# Patient Record
Sex: Male | Born: 1975 | Race: Black or African American | Hispanic: No | Smoking: Never smoker
Health system: Southern US, Community
[De-identification: ages and names within clinical notes are randomized; demographics above are authoritative.]

## PROBLEM LIST (undated history)

## (undated) DIAGNOSIS — I428 Other cardiomyopathies: Secondary | ICD-10-CM

## (undated) DIAGNOSIS — Z950 Presence of cardiac pacemaker: Secondary | ICD-10-CM

## (undated) DIAGNOSIS — N185 Chronic kidney disease, stage 5: Secondary | ICD-10-CM

## (undated) DIAGNOSIS — N289 Disorder of kidney and ureter, unspecified: Secondary | ICD-10-CM

## (undated) DIAGNOSIS — I2510 Atherosclerotic heart disease of native coronary artery without angina pectoris: Secondary | ICD-10-CM

## (undated) DIAGNOSIS — I639 Cerebral infarction, unspecified: Secondary | ICD-10-CM

## (undated) DIAGNOSIS — I480 Paroxysmal atrial fibrillation: Secondary | ICD-10-CM

## (undated) DIAGNOSIS — R002 Palpitations: Secondary | ICD-10-CM

## (undated) DIAGNOSIS — M109 Gout, unspecified: Secondary | ICD-10-CM

## (undated) DIAGNOSIS — I484 Atypical atrial flutter: Secondary | ICD-10-CM

## (undated) DIAGNOSIS — E119 Type 2 diabetes mellitus without complications: Secondary | ICD-10-CM

## (undated) DIAGNOSIS — I5020 Unspecified systolic (congestive) heart failure: Secondary | ICD-10-CM

## (undated) DIAGNOSIS — I509 Heart failure, unspecified: Secondary | ICD-10-CM

## (undated) DIAGNOSIS — Z95810 Presence of automatic (implantable) cardiac defibrillator: Secondary | ICD-10-CM

## (undated) DIAGNOSIS — I499 Cardiac arrhythmia, unspecified: Secondary | ICD-10-CM

## (undated) DIAGNOSIS — I779 Disorder of arteries and arterioles, unspecified: Secondary | ICD-10-CM

## (undated) HISTORY — PX: ABLATION: SHX5711

## (undated) HISTORY — PX: CARDIAC DEFIBRILLATOR PLACEMENT: SHX171

## (undated) HISTORY — PX: INSERT / REPLACE / REMOVE PACEMAKER: SUR710

## (undated) HISTORY — PX: CORONARY ANGIOPLASTY: SHX604

---

## 2008-07-30 ENCOUNTER — Inpatient Hospital Stay: Payer: Self-pay | Admitting: Specialist

## 2008-09-02 ENCOUNTER — Other Ambulatory Visit: Payer: Self-pay | Admitting: Nephrology

## 2009-01-03 ENCOUNTER — Inpatient Hospital Stay: Payer: Self-pay | Admitting: Internal Medicine

## 2010-02-18 ENCOUNTER — Observation Stay: Payer: Self-pay | Admitting: Specialist

## 2010-04-26 ENCOUNTER — Inpatient Hospital Stay: Payer: Self-pay | Admitting: Internal Medicine

## 2010-05-04 ENCOUNTER — Other Ambulatory Visit: Payer: Self-pay | Admitting: Internal Medicine

## 2010-05-10 ENCOUNTER — Inpatient Hospital Stay: Payer: Self-pay | Admitting: Internal Medicine

## 2012-01-18 ENCOUNTER — Emergency Department: Payer: Self-pay | Admitting: Emergency Medicine

## 2012-01-19 LAB — CBC
HCT: 45 % (ref 40.0–52.0)
HGB: 15 g/dL (ref 13.0–18.0)
MCHC: 33.4 g/dL (ref 32.0–36.0)
MCV: 89 fL (ref 80–100)
RBC: 5.08 10*6/uL (ref 4.40–5.90)
WBC: 10.1 10*3/uL (ref 3.8–10.6)

## 2012-01-19 LAB — BASIC METABOLIC PANEL
Anion Gap: 7 (ref 7–16)
BUN: 14 mg/dL (ref 7–18)
Calcium, Total: 8.9 mg/dL (ref 8.5–10.1)
Chloride: 100 mmol/L (ref 98–107)
Co2: 26 mmol/L (ref 21–32)
Creatinine: 0.95 mg/dL (ref 0.60–1.30)
Osmolality: 291 (ref 275–301)

## 2012-01-20 LAB — HEMOGLOBIN A1C

## 2017-01-11 ENCOUNTER — Ambulatory Visit: Admission: RE | Admit: 2017-01-11 | Payer: Medicare Other | Source: Ambulatory Visit | Admitting: Ophthalmology

## 2017-01-11 ENCOUNTER — Encounter: Admission: RE | Payer: Self-pay | Source: Ambulatory Visit

## 2017-01-11 SURGERY — PHACOEMULSIFICATION, CATARACT, WITH IOL INSERTION
Anesthesia: Choice | Laterality: Right

## 2017-02-14 ENCOUNTER — Encounter: Payer: Self-pay | Admitting: *Deleted

## 2017-02-14 NOTE — Pre-Procedure Instructions (Signed)
CLEARED BY DR York Ram Chrissie Noa 02/04/17

## 2017-02-15 ENCOUNTER — Encounter
Admission: RE | Disposition: A | Discharge status: Home / Self Care | Payer: Self-pay | Source: Ambulatory Visit | Attending: Ophthalmology

## 2017-02-15 ENCOUNTER — Ambulatory Visit
Admission: RE | Admit: 2017-02-15 | Discharge: 2017-02-15 | Disposition: A | Discharge status: Home / Self Care | Payer: Medicare Other | Source: Ambulatory Visit | Attending: Ophthalmology | Admitting: Ophthalmology

## 2017-02-15 ENCOUNTER — Ambulatory Visit: Payer: Medicare Other | Admitting: Certified Registered Nurse Anesthetist

## 2017-02-15 ENCOUNTER — Other Ambulatory Visit: Payer: Self-pay

## 2017-02-15 ENCOUNTER — Encounter: Payer: Self-pay | Admitting: *Deleted

## 2017-02-15 DIAGNOSIS — I509 Heart failure, unspecified: Secondary | ICD-10-CM | POA: Insufficient documentation

## 2017-02-15 DIAGNOSIS — E119 Type 2 diabetes mellitus without complications: Secondary | ICD-10-CM | POA: Insufficient documentation

## 2017-02-15 DIAGNOSIS — H2511 Age-related nuclear cataract, right eye: Secondary | ICD-10-CM | POA: Insufficient documentation

## 2017-02-15 DIAGNOSIS — I2510 Atherosclerotic heart disease of native coronary artery without angina pectoris: Secondary | ICD-10-CM | POA: Insufficient documentation

## 2017-02-15 DIAGNOSIS — Z7982 Long term (current) use of aspirin: Secondary | ICD-10-CM | POA: Diagnosis not present

## 2017-02-15 DIAGNOSIS — Z6839 Body mass index (BMI) 39.0-39.9, adult: Secondary | ICD-10-CM | POA: Diagnosis not present

## 2017-02-15 DIAGNOSIS — Z95810 Presence of automatic (implantable) cardiac defibrillator: Secondary | ICD-10-CM | POA: Insufficient documentation

## 2017-02-15 DIAGNOSIS — Z79899 Other long term (current) drug therapy: Secondary | ICD-10-CM | POA: Diagnosis not present

## 2017-02-15 DIAGNOSIS — I4891 Unspecified atrial fibrillation: Secondary | ICD-10-CM | POA: Insufficient documentation

## 2017-02-15 DIAGNOSIS — Z9861 Coronary angioplasty status: Secondary | ICD-10-CM | POA: Insufficient documentation

## 2017-02-15 DIAGNOSIS — I110 Hypertensive heart disease with heart failure: Secondary | ICD-10-CM | POA: Insufficient documentation

## 2017-02-15 HISTORY — DX: Atherosclerotic heart disease of native coronary artery without angina pectoris: I25.10

## 2017-02-15 HISTORY — PX: CATARACT EXTRACTION W/PHACO: SHX586

## 2017-02-15 HISTORY — DX: Heart failure, unspecified: I50.9

## 2017-02-15 HISTORY — DX: Palpitations: R00.2

## 2017-02-15 HISTORY — DX: Cardiac arrhythmia, unspecified: I49.9

## 2017-02-15 HISTORY — DX: Type 2 diabetes mellitus without complications: E11.9

## 2017-02-15 HISTORY — DX: Presence of cardiac pacemaker: Z95.0

## 2017-02-15 LAB — GLUCOSE, CAPILLARY: Glucose-Capillary: 287 mg/dL — ABNORMAL HIGH (ref 65–99)

## 2017-02-15 SURGERY — PHACOEMULSIFICATION, CATARACT, WITH IOL INSERTION
Anesthesia: Topical | Site: Eye | Laterality: Right | Wound class: Clean

## 2017-02-15 MED ORDER — EPINEPHRINE PF 1 MG/ML IJ SOLN
INTRAMUSCULAR | Status: AC
  Filled 2017-02-15: qty 1

## 2017-02-15 MED ORDER — NA CHONDROIT SULF-NA HYALURON 40-17 MG/ML IO SOLN
INTRAOCULAR | Status: DC | PRN
  Administered 2017-02-15: 1 mL via INTRAOCULAR

## 2017-02-15 MED ORDER — ARMC OPHTHALMIC DILATING DROPS
1.0000 "application " | OPHTHALMIC | Status: AC
  Administered 2017-02-15 (×2): 1 via OPHTHALMIC

## 2017-02-15 MED ORDER — INSULIN ASPART 100 UNIT/ML ~~LOC~~ SOLN
SUBCUTANEOUS | Status: AC
  Filled 2017-02-15: qty 1

## 2017-02-15 MED ORDER — MOXIFLOXACIN HCL 0.5 % OP SOLN
OPHTHALMIC | Status: AC
  Filled 2017-02-15: qty 3

## 2017-02-15 MED ORDER — LIDOCAINE HCL (PF) 4 % IJ SOLN
INTRAMUSCULAR | Status: AC
  Filled 2017-02-15: qty 5

## 2017-02-15 MED ORDER — ARMC OPHTHALMIC DILATING DROPS
OPHTHALMIC | Status: AC
  Administered 2017-02-15: 07:00:00
  Filled 2017-02-15: qty 0.4

## 2017-02-15 MED ORDER — NA CHONDROIT SULF-NA HYALURON 40-17 MG/ML IO SOLN
INTRAOCULAR | Status: AC
  Filled 2017-02-15: qty 1

## 2017-02-15 MED ORDER — FENTANYL CITRATE (PF) 100 MCG/2ML IJ SOLN
INTRAMUSCULAR | Status: DC | PRN
  Administered 2017-02-15: 25 ug via INTRAVENOUS
  Administered 2017-02-15: 75 ug via INTRAVENOUS

## 2017-02-15 MED ORDER — SODIUM CHLORIDE 0.9 % IV SOLN
INTRAVENOUS | Status: DC
  Administered 2017-02-15: 06:00:00 via INTRAVENOUS

## 2017-02-15 MED ORDER — LIDOCAINE HCL (PF) 4 % IJ SOLN
INTRAOCULAR | Status: DC | PRN
  Administered 2017-02-15: 2 mL via OPHTHALMIC

## 2017-02-15 MED ORDER — EPINEPHRINE PF 1 MG/ML IJ SOLN
INTRAOCULAR | Status: DC | PRN
  Administered 2017-02-15: 1 mL via OPHTHALMIC

## 2017-02-15 MED ORDER — POVIDONE-IODINE 5 % OP SOLN
OPHTHALMIC | Status: AC
  Filled 2017-02-15: qty 30

## 2017-02-15 MED ORDER — CARBACHOL 0.01 % IO SOLN
INTRAOCULAR | Status: DC | PRN
  Administered 2017-02-15: 0.5 mL via INTRAOCULAR

## 2017-02-15 MED ORDER — INSULIN ASPART 100 UNIT/ML ~~LOC~~ SOLN
4.0000 [IU] | Freq: Once | SUBCUTANEOUS | Status: AC
  Administered 2017-02-15: 4 [IU] via SUBCUTANEOUS

## 2017-02-15 MED ORDER — MOXIFLOXACIN HCL 0.5 % OP SOLN
OPHTHALMIC | Status: DC | PRN
  Administered 2017-02-15: 0.2 mL via OPHTHALMIC

## 2017-02-15 MED ORDER — POVIDONE-IODINE 5 % OP SOLN
OPHTHALMIC | Status: DC | PRN
  Administered 2017-02-15: 1 via OPHTHALMIC

## 2017-02-15 MED ORDER — FENTANYL CITRATE (PF) 100 MCG/2ML IJ SOLN
INTRAMUSCULAR | Status: AC
  Filled 2017-02-15: qty 2

## 2017-02-15 MED ORDER — ONDANSETRON HCL 4 MG/2ML IJ SOLN: 4.0000 mg | Freq: Once | INTRAMUSCULAR | Status: DC | PRN

## 2017-02-15 MED ORDER — MOXIFLOXACIN HCL 0.5 % OP SOLN: 1.0000 [drp] | OPHTHALMIC | Status: DC | PRN

## 2017-02-15 MED ORDER — FENTANYL CITRATE (PF) 100 MCG/2ML IJ SOLN: 25.0000 ug | INTRAMUSCULAR | Status: DC | PRN

## 2017-02-15 SURGICAL SUPPLY — 16 items
GLOVE BIO SURGEON STRL SZ8 (GLOVE) ×3 IMPLANT
GLOVE BIOGEL M 6.5 STRL (GLOVE) ×3 IMPLANT
GLOVE SURG LX 8.0 MICRO (GLOVE) ×2
GLOVE SURG LX STRL 8.0 MICRO (GLOVE) ×1 IMPLANT
GOWN STRL REUS W/ TWL LRG LVL3 (GOWN DISPOSABLE) ×2 IMPLANT
GOWN STRL REUS W/TWL LRG LVL3 (GOWN DISPOSABLE) ×4
LABEL CATARACT MEDS ST (LABEL) ×3 IMPLANT
LENS IOL TECNIS ITEC 24.5 (Intraocular Lens) ×3 IMPLANT
PACK CATARACT (MISCELLANEOUS) ×3 IMPLANT
PACK CATARACT BRASINGTON LX (MISCELLANEOUS) ×3 IMPLANT
PACK EYE AFTER SURG (MISCELLANEOUS) ×3 IMPLANT
SOL BSS BAG (MISCELLANEOUS) ×3
SOLUTION BSS BAG (MISCELLANEOUS) ×1 IMPLANT
SYR 5ML LL (SYRINGE) ×3 IMPLANT
WATER STERILE IRR 250ML POUR (IV SOLUTION) ×3 IMPLANT
WIPE NON LINTING 3.25X3.25 (MISCELLANEOUS) ×3 IMPLANT

## 2017-02-15 NOTE — Discharge Instructions (Signed)
Eye Surgery Discharge Instructions  Expect mild scratchy sensation or mild soreness. DO NOT RUB YOUR EYE!  The day of surgery:  Minimal physical activity, but bed rest is not required  No reading, computer work, or close hand work  No bending, lifting, or straining.  May watch TV  For 24 hours:  No driving, legal decisions, or alcoholic beverages  Safety precautions  Eat anything you prefer: It is better to start with liquids, then soup then solid foods.  _____ Eye patch should be worn until postoperative exam tomorrow.  ____ Solar shield eyeglasses should be worn for comfort in the sunlight/patch while sleeping  Resume all regular medications including aspirin or Coumadin if these were discontinued prior to surgery. You may shower, bathe, shave, or wash your hair. Tylenol may be taken for mild discomfort.  Call your doctor if you experience significant pain, nausea, or vomiting, fever > 101 or other signs of infection. [phone] or [phone] Specific instructions:  Follow-up Information    Birder Robson, MD Follow up on 02/16/2017.   Specialty:  Ophthalmology Why:  10:25 Contact information: [address] [place] [address] [phone]

## 2017-02-15 NOTE — Op Note (Signed)
PREOPERATIVE DIAGNOSIS:  Nuclear sclerotic cataract of the right eye.   POSTOPERATIVE DIAGNOSIS:  nuclear sclerotic cataract right eye   OPERATIVE PROCEDURE: Procedure(s): CATARACT EXTRACTION PHACO AND INTRAOCULAR LENS PLACEMENT (IOC)   SURGEON:  Birder Robson, MD.   ANESTHESIA:  Anesthesiologist: Molli Barrows, MD CRNA: Demetrius Charity, CRNA  1.      Managed anesthesia care. 2.      0.51ml of Shugarcaine was instilled in the eye following the paracentesis.   COMPLICATIONS:  None.   TECHNIQUE:   Stop and chop   DESCRIPTION OF PROCEDURE:  The patient was examined and consented in the preoperative holding area where the aforementioned topical anesthesia was applied to the right eye and then brought back to the Operating Room where the right eye was prepped and draped in the usual sterile ophthalmic fashion and a lid speculum was placed. A paracentesis was created with the side port blade and the anterior chamber was filled with viscoelastic. A near clear corneal incision was performed with the steel keratome. A continuous curvilinear capsulorrhexis was performed with a cystotome followed by the capsulorrhexis forceps. Hydrodissection and hydrodelineation were carried out with BSS on a blunt cannula. The lens was removed in a stop and chop  technique and the remaining cortical material was removed with the irrigation-aspiration handpiece. The capsular bag was inflated with viscoelastic and the Technis ZCB00  lens was placed in the capsular bag without complication. The remaining viscoelastic was removed from the eye with the irrigation-aspiration handpiece. The wounds were hydrated. The anterior chamber was flushed with Miostat and the eye was inflated to physiologic pressure. 0.48ml of Vigamox was placed in the anterior chamber. The wounds were found to be water tight. The eye was dressed with Vigamox. The patient was given protective glasses to wear throughout the day and a shield with which  to sleep tonight. The patient was also given drops with which to begin a drop regimen today and will follow-up with me in one day. Implant Name Type Inv. Item Serial No. Manufacturer Lot No. LRB No. Used  LENS IOL DIOP 24.5 - [id number] Intraocular Lens LENS IOL DIOP 24.5 [id number]  Right 1   Procedure(s) with comments: CATARACT EXTRACTION PHACO AND INTRAOCULAR LENS PLACEMENT (IOC) (Right) - [place] 00:48 AP% 14.8 CDE 7.11 Fluid pack lot # [id number]  Electronically signed: Birder Robson 02/15/2017 7:48 AM

## 2017-02-15 NOTE — Anesthesia Procedure Notes (Signed)
Procedure Name: MAC Performed by: Robb Sibal, CRNA Pre-anesthesia Checklist: Patient identified, Emergency Drugs available, Suction available, Patient being monitored and Timeout performed Oxygen Delivery Method: Nasal cannula       

## 2017-02-15 NOTE — Anesthesia Post-op Follow-up Note (Signed)
Anesthesia QCDR form completed.        

## 2017-02-15 NOTE — H&P (Signed)
All labs reviewed. Abnormal studies sent to patients PCP when indicated.  Previous H&P reviewed, patient examined, there are NO CHANGES.  Terry Willcox Porfilio1/15/20197:16 AM

## 2017-02-15 NOTE — Anesthesia Postprocedure Evaluation (Signed)
Anesthesia Post Note  Patient: Terry [name]  Procedure(s) Performed: CATARACT EXTRACTION PHACO AND INTRAOCULAR LENS PLACEMENT ([place]) (Right Eye)  Patient location during evaluation: PACU Anesthesia Type: MAC Level of consciousness: awake and alert and oriented Pain management: pain level controlled Vital Signs Assessment: post-procedure vital signs reviewed and stable Respiratory status: spontaneous breathing Cardiovascular status: blood pressure returned to baseline Anesthetic complications: no     Last Vitals:  Vitals:   02/15/17 0617  BP: (!) 162/96  Pulse: 77  Resp: 16  Temp: 36.5 C  SpO2: 100%    Last Pain:  Vitals:   02/15/17 0617  TempSrc: Oral                 Blima Singer

## 2017-02-15 NOTE — Transfer of Care (Signed)
Immediate Anesthesia Transfer of Care Note  Patient: Terry [name]  Procedure(s) Performed: CATARACT EXTRACTION PHACO AND INTRAOCULAR LENS PLACEMENT (IOC) (Right Eye)  Patient Location: PACU  Anesthesia Type:MAC  Level of Consciousness: awake, alert  and oriented  Airway & Oxygen Therapy: Patient Spontanous Breathing  Post-op Assessment: Report given to RN and Post -op Vital signs reviewed and stable  Post vital signs: Reviewed and stable  Last Vitals:  Vitals:   02/15/17 0617  BP: (!) 162/96  Pulse: 77  Resp: 16  Temp: 36.5 C  SpO2: 100%    Last Pain:  Vitals:   02/15/17 0617  TempSrc: Oral         Complications: No apparent anesthesia complications

## 2017-02-15 NOTE — Anesthesia Preprocedure Evaluation (Signed)
Anesthesia Evaluation  Patient identified by MRN, date of birth, ID band Patient awake    Reviewed: Allergy & Precautions, H&P , NPO status , Patient's Chart, lab work & pertinent test results, reviewed documented beta blocker date and time   Airway Mallampati: III  TM Distance: >3 FB Neck ROM: full    Dental no notable dental hx. (+) Teeth Intact   Pulmonary neg pulmonary ROS,    Pulmonary exam normal breath sounds clear to auscultation       Cardiovascular Exercise Tolerance: Good hypertension, + CAD and +CHF  negative cardio ROS  + dysrhythmias + pacemaker  Rhythm:regular Rate:Normal     Neuro/Psych negative neurological ROS  negative psych ROS   GI/Hepatic negative GI ROS, Neg liver ROS,   Endo/Other  negative endocrine ROSdiabetes, Poorly ControlledMorbid obesity  Renal/GU      Musculoskeletal   Abdominal   Peds  Hematology negative hematology ROS (+)   Anesthesia Other Findings   Reproductive/Obstetrics negative OB ROS                             Anesthesia Physical Anesthesia Plan  ASA: III  Anesthesia Plan: MAC   Post-op Pain Management:    Induction:   PONV Risk Score and Plan:   Airway Management Planned:   Additional Equipment:   Intra-op Plan:   Post-operative Plan:   Informed Consent: I have reviewed the patients History and Physical, chart, labs and discussed the procedure including the risks, benefits and alternatives for the proposed anesthesia with the patient or authorized representative who has indicated his/her understanding and acceptance.     Plan Discussed with: CRNA  Anesthesia Plan Comments:         Anesthesia Quick Evaluation

## 2017-02-16 ENCOUNTER — Encounter: Payer: Self-pay | Admitting: Ophthalmology

## 2017-03-07 ENCOUNTER — Encounter: Payer: Self-pay | Admitting: *Deleted

## 2017-03-15 ENCOUNTER — Ambulatory Visit: Payer: Medicare Other | Admitting: Anesthesiology

## 2017-03-15 ENCOUNTER — Encounter
Admission: RE | Disposition: A | Discharge status: Home / Self Care | Payer: Self-pay | Source: Ambulatory Visit | Attending: Ophthalmology

## 2017-03-15 ENCOUNTER — Ambulatory Visit
Admission: RE | Admit: 2017-03-15 | Discharge: 2017-03-15 | Disposition: A | Discharge status: Home / Self Care | Payer: Medicare Other | Source: Ambulatory Visit | Attending: Ophthalmology | Admitting: Ophthalmology

## 2017-03-15 DIAGNOSIS — I2510 Atherosclerotic heart disease of native coronary artery without angina pectoris: Secondary | ICD-10-CM | POA: Insufficient documentation

## 2017-03-15 DIAGNOSIS — I110 Hypertensive heart disease with heart failure: Secondary | ICD-10-CM | POA: Insufficient documentation

## 2017-03-15 DIAGNOSIS — I4891 Unspecified atrial fibrillation: Secondary | ICD-10-CM | POA: Insufficient documentation

## 2017-03-15 DIAGNOSIS — E1136 Type 2 diabetes mellitus with diabetic cataract: Secondary | ICD-10-CM | POA: Insufficient documentation

## 2017-03-15 DIAGNOSIS — I509 Heart failure, unspecified: Secondary | ICD-10-CM | POA: Diagnosis not present

## 2017-03-15 HISTORY — PX: CATARACT EXTRACTION W/PHACO: SHX586

## 2017-03-15 HISTORY — DX: Presence of automatic (implantable) cardiac defibrillator: Z95.810

## 2017-03-15 LAB — GLUCOSE, CAPILLARY
GLUCOSE-CAPILLARY: 471 mg/dL — AB (ref 65–99)
GLUCOSE-CAPILLARY: 486 mg/dL — AB (ref 65–99)
Glucose-Capillary: 463 mg/dL — ABNORMAL HIGH (ref 65–99)
Glucose-Capillary: 472 mg/dL — ABNORMAL HIGH (ref 65–99)
Glucose-Capillary: 379 mg/dL — ABNORMAL HIGH (ref 65–99)
Glucose-Capillary: 439 mg/dL — ABNORMAL HIGH (ref 65–99)

## 2017-03-15 SURGERY — PHACOEMULSIFICATION, CATARACT, WITH IOL INSERTION
Anesthesia: Monitor Anesthesia Care | Site: Eye | Laterality: Left | Wound class: Clean

## 2017-03-15 MED ORDER — FENTANYL CITRATE (PF) 100 MCG/2ML IJ SOLN
INTRAMUSCULAR | Status: AC
  Filled 2017-03-15: qty 2

## 2017-03-15 MED ORDER — SODIUM CHLORIDE 0.9 % IV SOLN
INTRAVENOUS | Status: DC
  Administered 2017-03-15: 09:00:00 via INTRAVENOUS

## 2017-03-15 MED ORDER — MOXIFLOXACIN HCL 0.5 % OP SOLN: 1.0000 [drp] | OPHTHALMIC | Status: DC | PRN

## 2017-03-15 MED ORDER — EPINEPHRINE PF 1 MG/ML IJ SOLN
INTRAOCULAR | Status: DC | PRN
  Administered 2017-03-15: 09:00:00 via OPHTHALMIC

## 2017-03-15 MED ORDER — INSULIN ASPART 100 UNIT/ML ~~LOC~~ SOLN
SUBCUTANEOUS | Status: AC
  Administered 2017-03-15: 10 [IU] via SUBCUTANEOUS
  Filled 2017-03-15: qty 1

## 2017-03-15 MED ORDER — MOXIFLOXACIN HCL 0.5 % OP SOLN
OPHTHALMIC | Status: AC
  Filled 2017-03-15: qty 3

## 2017-03-15 MED ORDER — ARMC OPHTHALMIC DILATING DROPS
1.0000 "application " | OPHTHALMIC | Status: AC
  Administered 2017-03-15 (×3): 1 via OPHTHALMIC

## 2017-03-15 MED ORDER — ARMC OPHTHALMIC DILATING DROPS
OPHTHALMIC | Status: AC
  Administered 2017-03-15: 1 via OPHTHALMIC
  Filled 2017-03-15: qty 0.4

## 2017-03-15 MED ORDER — TETRACAINE HCL 0.5 % OP SOLN
OPHTHALMIC | Status: AC
  Filled 2017-03-15: qty 4

## 2017-03-15 MED ORDER — MIDAZOLAM HCL 2 MG/2ML IJ SOLN
INTRAMUSCULAR | Status: AC
  Filled 2017-03-15: qty 2

## 2017-03-15 MED ORDER — MIDAZOLAM HCL 2 MG/2ML IJ SOLN
INTRAMUSCULAR | Status: DC | PRN
  Administered 2017-03-15: 2 mg via INTRAVENOUS

## 2017-03-15 MED ORDER — MOXIFLOXACIN HCL 0.5 % OP SOLN
OPHTHALMIC | Status: DC | PRN
  Administered 2017-03-15: 0.2 mL via OPHTHALMIC

## 2017-03-15 MED ORDER — NA CHONDROIT SULF-NA HYALURON 40-17 MG/ML IO SOLN
INTRAOCULAR | Status: DC | PRN
  Administered 2017-03-15: 1 mL via INTRAOCULAR

## 2017-03-15 MED ORDER — LIDOCAINE HCL (PF) 4 % IJ SOLN
INTRAOCULAR | Status: DC | PRN
  Administered 2017-03-15: 4 mL via OPHTHALMIC

## 2017-03-15 MED ORDER — INSULIN ASPART 100 UNIT/ML ~~LOC~~ SOLN
10.0000 [IU] | Freq: Once | SUBCUTANEOUS | Status: AC
  Administered 2017-03-15 (×2): 10 [IU] via SUBCUTANEOUS

## 2017-03-15 MED ORDER — POVIDONE-IODINE 5 % OP SOLN
OPHTHALMIC | Status: DC | PRN
  Administered 2017-03-15: 1 via OPHTHALMIC

## 2017-03-15 MED ORDER — INSULIN ASPART 100 UNIT/ML ~~LOC~~ SOLN: 10.0000 [IU] | Freq: Once | SUBCUTANEOUS | Status: DC

## 2017-03-15 MED ORDER — POVIDONE-IODINE 5 % OP SOLN
OPHTHALMIC | Status: AC
  Filled 2017-03-15: qty 30

## 2017-03-15 MED ORDER — EPINEPHRINE PF 1 MG/ML IJ SOLN
INTRAMUSCULAR | Status: AC
  Filled 2017-03-15: qty 2

## 2017-03-15 MED ORDER — LIDOCAINE HCL (PF) 4 % IJ SOLN
INTRAMUSCULAR | Status: AC
  Filled 2017-03-15: qty 5

## 2017-03-15 MED ORDER — NA CHONDROIT SULF-NA HYALURON 40-17 MG/ML IO SOLN
INTRAOCULAR | Status: AC
  Filled 2017-03-15: qty 1

## 2017-03-15 MED ORDER — FENTANYL CITRATE (PF) 100 MCG/2ML IJ SOLN
INTRAMUSCULAR | Status: DC | PRN
  Administered 2017-03-15: 50 ug via INTRAVENOUS

## 2017-03-15 MED ORDER — CARBACHOL 0.01 % IO SOLN
INTRAOCULAR | Status: DC | PRN
  Administered 2017-03-15: 0.5 mL via INTRAOCULAR

## 2017-03-15 MED ORDER — TETRACAINE HCL 0.5 % OP SOLN
OPHTHALMIC | Status: DC | PRN
  Administered 2017-03-15: 1 [drp] via OPHTHALMIC

## 2017-03-15 SURGICAL SUPPLY — 16 items
GLOVE BIO SURGEON STRL SZ8 (GLOVE) ×3 IMPLANT
GLOVE BIOGEL M 6.5 STRL (GLOVE) ×3 IMPLANT
GLOVE SURG LX 8.0 MICRO (GLOVE) ×2
GLOVE SURG LX STRL 8.0 MICRO (GLOVE) ×1 IMPLANT
GOWN STRL REUS W/ TWL LRG LVL3 (GOWN DISPOSABLE) ×2 IMPLANT
GOWN STRL REUS W/TWL LRG LVL3 (GOWN DISPOSABLE) ×4
LABEL CATARACT MEDS ST (LABEL) ×3 IMPLANT
LENS IOL TECNIS ITEC 24.0 (Intraocular Lens) ×3 IMPLANT
PACK CATARACT (MISCELLANEOUS) ×3 IMPLANT
PACK CATARACT BRASINGTON LX (MISCELLANEOUS) ×3 IMPLANT
PACK EYE AFTER SURG (MISCELLANEOUS) ×3 IMPLANT
SOL BSS BAG (MISCELLANEOUS) ×3
SOLUTION BSS BAG (MISCELLANEOUS) ×1 IMPLANT
SYR 5ML LL (SYRINGE) ×3 IMPLANT
WATER STERILE IRR 250ML POUR (IV SOLUTION) ×3 IMPLANT
WIPE NON LINTING 3.25X3.25 (MISCELLANEOUS) ×3 IMPLANT

## 2017-03-15 NOTE — Transfer of Care (Signed)
Immediate Anesthesia Transfer of Care Note  Patient: Terry [name]  Procedure(s) Performed: CATARACT EXTRACTION PHACO AND INTRAOCULAR LENS PLACEMENT (IOC) (Left Eye)  Patient Location: PACU  Anesthesia Type:MAC  Level of Consciousness: awake, alert  and oriented  Airway & Oxygen Therapy: Patient Spontanous Breathing  Post-op Assessment: Report given to RN and Post -op Vital signs reviewed and stable  Post vital signs: Reviewed and stable  Last Vitals:  Vitals:   03/15/17 0641  BP: 126/76  Pulse: 86  Resp: 19  Temp: (!) 36.3 C  SpO2: 100%    Last Pain:  Vitals:   03/15/17 0641  TempSrc: Tympanic         Complications: No apparent anesthesia complications

## 2017-03-15 NOTE — Anesthesia Postprocedure Evaluation (Signed)
Anesthesia Post Note  Patient: Terry [name]  Procedure(s) Performed: CATARACT EXTRACTION PHACO AND INTRAOCULAR LENS PLACEMENT ([place]) (Left Eye)  Patient location during evaluation: PACU Anesthesia Type: MAC Level of consciousness: awake and alert and oriented Pain management: pain level controlled Vital Signs Assessment: post-procedure vital signs reviewed and stable Respiratory status: spontaneous breathing, nonlabored ventilation and respiratory function stable Cardiovascular status: blood pressure returned to baseline and stable Postop Assessment: no signs of nausea or vomiting Anesthetic complications: no     Last Vitals:  Vitals:   03/15/17 0641 03/15/17 0920  BP: 126/76 (!) 93/51  Pulse: 86 91  Resp: 19 12  Temp: (!) 36.3 C (!) 36.3 C  SpO2: 100% 100%    Last Pain:  Vitals:   03/15/17 0920  TempSrc: Temporal                 Shalayne Leach

## 2017-03-15 NOTE — Op Note (Signed)
PREOPERATIVE DIAGNOSIS:  Nuclear sclerotic cataract of the left eye.   POSTOPERATIVE DIAGNOSIS:  Nuclear sclerotic cataract of the left eye.   OPERATIVE PROCEDURE: Procedure(s): CATARACT EXTRACTION PHACO AND INTRAOCULAR LENS PLACEMENT (IOC)   SURGEON:  Birder Robson, MD.   ANESTHESIA:  Anesthesiologist: Emmie Niemann, MD CRNA: Demetrius Charity, CRNA; Hedda Slade, CRNA  1.      Managed anesthesia care. 2.     0.6ml of Shugarcaine was instilled following the paracentesis   COMPLICATIONS:  None.   TECHNIQUE:   Stop and chop   DESCRIPTION OF PROCEDURE:  The patient was examined and consented in the preoperative holding area where the aforementioned topical anesthesia was applied to the left eye and then brought back to the Operating Room where the left eye was prepped and draped in the usual sterile ophthalmic fashion and a lid speculum was placed. A paracentesis was created with the side port blade and the anterior chamber was filled with viscoelastic. A near clear corneal incision was performed with the steel keratome. A continuous curvilinear capsulorrhexis was performed with a cystotome followed by the capsulorrhexis forceps. Hydrodissection and hydrodelineation were carried out with BSS on a blunt cannula. The lens was removed in a stop and chop  technique and the remaining cortical material was removed with the irrigation-aspiration handpiece. The capsular bag was inflated with viscoelastic and the Technis ZCB00 lens was placed in the capsular bag without complication. The remaining viscoelastic was removed from the eye with the irrigation-aspiration handpiece. The wounds were hydrated. The anterior chamber was flushed with Miostat and the eye was inflated to physiologic pressure. 0.36ml Vigamox was placed in the anterior chamber. The wounds were found to be water tight. The eye was dressed with Vigamox. The patient was given protective glasses to wear throughout the day and a shield with  which to sleep tonight. The patient was also given drops with which to begin a drop regimen today and will follow-up with me in one day. Implant Name Type Inv. Item Serial No. Manufacturer Lot No. LRB No. Used  LENS IOL DIOP 24.0 - [id number] Intraocular Lens LENS IOL DIOP 24.0 [id number]  Left 1    Procedure(s) with comments: CATARACT EXTRACTION PHACO AND INTRAOCULAR LENS PLACEMENT (IOC) (Left) - [place] 00:46.0 AP% 11.2 CDE 5.15 FLUID PACK LOT # [id number]  Electronically signed: Birder Robson 03/15/2017 9:18 AM

## 2017-03-15 NOTE — OR Nursing (Signed)
Dr Randa Lynn notified of glucose of 486 on arrival. Orders for Novalog regular insulin 10 units sq given. Rechecked glucose at 0738 and glucose down to 463. Rechecked at 0754 and elevated to 472. Additional orders for another 10 units of Regular Insulin ordered and given. Rechecked glucose at 086 and was 439. Dr Michelene Heady is aware and is going to proceed with surgery. Patient has an appointment later this month with primary physician to address this issue with elevated glucose levels.

## 2017-03-15 NOTE — Anesthesia Post-op Follow-up Note (Signed)
Anesthesia QCDR form completed.        

## 2017-03-15 NOTE — Anesthesia Procedure Notes (Signed)
Procedure Name: MAC Performed by: Demetrius Charity, CRNA Pre-anesthesia Checklist: Patient identified, Emergency Drugs available, Suction available, Patient being monitored and Timeout performed Oxygen Delivery Method: Nasal cannula

## 2017-03-15 NOTE — Discharge Instructions (Signed)
Eye Surgery Discharge Instructions  Expect mild scratchy sensation or mild soreness. DO NOT RUB YOUR EYE!  The day of surgery:  Minimal physical activity, but bed rest is not required  No reading, computer work, or close hand work  No bending, lifting, or straining.  May watch TV  For 24 hours:  No driving, legal decisions, or alcoholic beverages  Safety precautions  Eat anything you prefer: It is better to start with liquids, then soup then solid foods.  _____ Eye patch should be worn until postoperative exam tomorrow.  ____ Solar shield eyeglasses should be worn for comfort in the sunlight/patch while sleeping  Resume all regular medications including aspirin or Coumadin if these were discontinued prior to surgery. You may shower, bathe, shave, or wash your hair. Tylenol may be taken for mild discomfort.  Call your doctor if you experience significant pain, nausea, or vomiting, fever > 101 or other signs of infection. [phone] or [phone] Specific instructions:  Follow-up Information    Birder Robson, MD Follow up.   Specialty:  Ophthalmology Why:  February 13 at 10:35am Contact information: [address] [place] [address] [phone]

## 2017-03-15 NOTE — Anesthesia Preprocedure Evaluation (Signed)
Anesthesia Evaluation  Patient identified by MRN, date of birth, ID band Patient awake    Reviewed: Allergy & Precautions, NPO status , Patient's Chart, lab work & pertinent test results  History of Anesthesia Complications Negative for: history of anesthetic complications  Airway Mallampati: II  TM Distance: >3 FB Neck ROM: Full    Dental no notable dental hx.    Pulmonary neg pulmonary ROS, neg sleep apnea, neg COPD,    breath sounds clear to auscultation- rhonchi (-) wheezing      Cardiovascular Exercise Tolerance: Good (-) hypertension+CHF  (-) CAD, (-) Past MI, (-) Cardiac Stents and (-) CABG + dysrhythmias Atrial Fibrillation + pacemaker + Cardiac Defibrillator  Rhythm:Regular Rate:Normal - Systolic murmurs and - Diastolic murmurs Echo 67/67/20:  Technically difficult study due to chest wall/lung interference  Dilated left ventricle - mild  Left ventricular hypertrophy - mild  Borderline left ventricular systolic function, ejection fraction 50%  Normal right ventricular systolic function  Dilated left atrium - mild   Neuro/Psych negative neurological ROS  negative psych ROS   GI/Hepatic negative GI ROS, Neg liver ROS,   Endo/Other  diabetes (not on any medications)  Renal/GU negative Renal ROS     Musculoskeletal negative musculoskeletal ROS (+)   Abdominal (+) + obese,   Peds  Hematology negative hematology ROS (+)   Anesthesia Other Findings Past Medical History: No date: AICD (automatic cardioverter/defibrillator) present No date: CHF (congestive heart failure) (HCC) No date: Coronary artery disease No date: Diabetes mellitus without complication (HCC) No date: Dysrhythmia     Comment:  AFIB No date: Palpitations No date: Presence of permanent cardiac pacemaker     Comment:  PACEMAKER / DEFIB   Reproductive/Obstetrics                             Anesthesia  Physical Anesthesia Plan  ASA: III  Anesthesia Plan: MAC   Post-op Pain Management:    Induction: Intravenous  PONV Risk Score and Plan: 1 and Midazolam  Airway Management Planned: Natural Airway  Additional Equipment:   Intra-op Plan:   Post-operative Plan:   Informed Consent: I have reviewed the patients History and Physical, chart, labs and discussed the procedure including the risks, benefits and alternatives for the proposed anesthesia with the patient or authorized representative who has indicated his/her understanding and acceptance.     Plan Discussed with: CRNA and Anesthesiologist  Anesthesia Plan Comments: (BS 486 on arrival, plan to treat with [place] insulin prior to procedure. Per report BS elevated when he came in for his last cataract surgery as well. Discussed with pt the importance of seeing his primary care doctor for treatment of diabetes.)        Anesthesia Quick Evaluation

## 2017-03-15 NOTE — H&P (Signed)
All labs reviewed. Abnormal studies sent to patients PCP when indicated.  Previous H&P reviewed, patient examined, there are NO CHANGES.  Terry Pflum Porfilio2/12/20197:51 AM

## 2017-03-15 NOTE — OR Nursing (Signed)
Pt may not have cataract surgery due to high blood glucose.

## 2017-03-16 ENCOUNTER — Encounter: Payer: Self-pay | Admitting: Ophthalmology

## 2019-08-29 ENCOUNTER — Other Ambulatory Visit: Payer: Self-pay

## 2019-08-29 ENCOUNTER — Emergency Department: Payer: Medicare Other

## 2019-08-29 ENCOUNTER — Observation Stay: Payer: Medicare Other

## 2019-08-29 ENCOUNTER — Encounter: Payer: Self-pay | Admitting: Emergency Medicine

## 2019-08-29 ENCOUNTER — Inpatient Hospital Stay
Admission: EM | Admit: 2019-08-29 | Discharge: 2019-08-31 | DRG: 683 | Disposition: A | Discharge status: Home / Self Care | Payer: Medicare Other | Attending: Internal Medicine | Admitting: Internal Medicine

## 2019-08-29 DIAGNOSIS — R739 Hyperglycemia, unspecified: Secondary | ICD-10-CM | POA: Diagnosis not present

## 2019-08-29 DIAGNOSIS — E1165 Type 2 diabetes mellitus with hyperglycemia: Secondary | ICD-10-CM | POA: Diagnosis present

## 2019-08-29 DIAGNOSIS — N170 Acute kidney failure with tubular necrosis: Secondary | ICD-10-CM | POA: Diagnosis not present

## 2019-08-29 DIAGNOSIS — E860 Dehydration: Secondary | ICD-10-CM | POA: Diagnosis present

## 2019-08-29 DIAGNOSIS — I5032 Chronic diastolic (congestive) heart failure: Secondary | ICD-10-CM | POA: Diagnosis present

## 2019-08-29 DIAGNOSIS — I248 Other forms of acute ischemic heart disease: Secondary | ICD-10-CM | POA: Diagnosis present

## 2019-08-29 DIAGNOSIS — I130 Hypertensive heart and chronic kidney disease with heart failure and stage 1 through stage 4 chronic kidney disease, or unspecified chronic kidney disease: Secondary | ICD-10-CM | POA: Diagnosis present

## 2019-08-29 DIAGNOSIS — N179 Acute kidney failure, unspecified: Secondary | ICD-10-CM | POA: Diagnosis present

## 2019-08-29 DIAGNOSIS — R7989 Other specified abnormal findings of blood chemistry: Secondary | ICD-10-CM | POA: Diagnosis present

## 2019-08-29 DIAGNOSIS — Z7982 Long term (current) use of aspirin: Secondary | ICD-10-CM

## 2019-08-29 DIAGNOSIS — G4733 Obstructive sleep apnea (adult) (pediatric): Secondary | ICD-10-CM | POA: Diagnosis present

## 2019-08-29 DIAGNOSIS — Z8041 Family history of malignant neoplasm of ovary: Secondary | ICD-10-CM

## 2019-08-29 DIAGNOSIS — E1129 Type 2 diabetes mellitus with other diabetic kidney complication: Secondary | ICD-10-CM

## 2019-08-29 DIAGNOSIS — Z20822 Contact with and (suspected) exposure to covid-19: Secondary | ICD-10-CM | POA: Diagnosis present

## 2019-08-29 DIAGNOSIS — I959 Hypotension, unspecified: Secondary | ICD-10-CM | POA: Diagnosis present

## 2019-08-29 DIAGNOSIS — N185 Chronic kidney disease, stage 5: Secondary | ICD-10-CM | POA: Diagnosis present

## 2019-08-29 DIAGNOSIS — Z833 Family history of diabetes mellitus: Secondary | ICD-10-CM

## 2019-08-29 DIAGNOSIS — Z79899 Other long term (current) drug therapy: Secondary | ICD-10-CM

## 2019-08-29 DIAGNOSIS — I2510 Atherosclerotic heart disease of native coronary artery without angina pectoris: Secondary | ICD-10-CM | POA: Diagnosis present

## 2019-08-29 DIAGNOSIS — I482 Chronic atrial fibrillation, unspecified: Secondary | ICD-10-CM | POA: Diagnosis present

## 2019-08-29 DIAGNOSIS — R112 Nausea with vomiting, unspecified: Secondary | ICD-10-CM | POA: Diagnosis present

## 2019-08-29 DIAGNOSIS — Z6841 Body Mass Index (BMI) 40.0 and over, adult: Secondary | ICD-10-CM

## 2019-08-29 DIAGNOSIS — E1122 Type 2 diabetes mellitus with diabetic chronic kidney disease: Secondary | ICD-10-CM | POA: Diagnosis present

## 2019-08-29 DIAGNOSIS — I951 Orthostatic hypotension: Secondary | ICD-10-CM | POA: Diagnosis present

## 2019-08-29 DIAGNOSIS — E785 Hyperlipidemia, unspecified: Secondary | ICD-10-CM | POA: Diagnosis present

## 2019-08-29 DIAGNOSIS — D72829 Elevated white blood cell count, unspecified: Secondary | ICD-10-CM | POA: Diagnosis present

## 2019-08-29 DIAGNOSIS — Z95810 Presence of automatic (implantable) cardiac defibrillator: Secondary | ICD-10-CM

## 2019-08-29 DIAGNOSIS — R778 Other specified abnormalities of plasma proteins: Secondary | ICD-10-CM | POA: Diagnosis present

## 2019-08-29 DIAGNOSIS — T502X5A Adverse effect of carbonic-anhydrase inhibitors, benzothiadiazides and other diuretics, initial encounter: Secondary | ICD-10-CM | POA: Diagnosis present

## 2019-08-29 DIAGNOSIS — Z8249 Family history of ischemic heart disease and other diseases of the circulatory system: Secondary | ICD-10-CM

## 2019-08-29 DIAGNOSIS — N184 Chronic kidney disease, stage 4 (severe): Secondary | ICD-10-CM | POA: Diagnosis present

## 2019-08-29 LAB — COMPREHENSIVE METABOLIC PANEL
ALT: 11 U/L (ref 0–44)
AST: 17 U/L (ref 15–41)
Albumin: 3.5 g/dL (ref 3.5–5.0)
Alkaline Phosphatase: 92 U/L (ref 38–126)
Anion gap: 14 (ref 5–15)
BUN: 80 mg/dL — ABNORMAL HIGH (ref 6–20)
CO2: 21 mmol/L — ABNORMAL LOW (ref 22–32)
Calcium: 8.5 mg/dL — ABNORMAL LOW (ref 8.9–10.3)
Chloride: 98 mmol/L (ref 98–111)
Creatinine, Ser: 5.18 mg/dL — ABNORMAL HIGH (ref 0.61–1.24)
GFR calc Af Amer: 14 mL/min — ABNORMAL LOW (ref >60)
GFR calc non Af Amer: 12 mL/min — ABNORMAL LOW (ref >60)
Glucose, Bld: 428 mg/dL — ABNORMAL HIGH (ref 70–99)
Potassium: 4.5 mmol/L (ref 3.5–5.1)
Sodium: 133 mmol/L — ABNORMAL LOW (ref 135–145)
Total Bilirubin: 0.9 mg/dL (ref 0.3–1.2)
Total Protein: 6.9 g/dL (ref 6.5–8.1)

## 2019-08-29 LAB — URINALYSIS, COMPLETE (UACMP) WITH MICROSCOPIC
Bacteria, UA: NONE SEEN
Bilirubin Urine: NEGATIVE
Glucose, UA: >=500 mg/dL — AB
Hgb urine dipstick: NEGATIVE
Ketones, ur: NEGATIVE mg/dL
Leukocytes,Ua: NEGATIVE
Nitrite: NEGATIVE
Protein, ur: NEGATIVE mg/dL
Specific Gravity, Urine: 1.013 (ref 1.005–1.030)
pH: 5 (ref 5.0–8.0)

## 2019-08-29 LAB — BASIC METABOLIC PANEL
Anion gap: 11 (ref 5–15)
BUN: 80 mg/dL — ABNORMAL HIGH (ref 6–20)
CO2: 22 mmol/L (ref 22–32)
Calcium: 8.7 mg/dL — ABNORMAL LOW (ref 8.9–10.3)
Chloride: 98 mmol/L (ref 98–111)
Creatinine, Ser: 4.88 mg/dL — ABNORMAL HIGH (ref 0.61–1.24)
GFR calc Af Amer: 15 mL/min — ABNORMAL LOW (ref >60)
GFR calc non Af Amer: 13 mL/min — ABNORMAL LOW (ref >60)
Glucose, Bld: 432 mg/dL — ABNORMAL HIGH (ref 70–99)
Potassium: 5.2 mmol/L — ABNORMAL HIGH (ref 3.5–5.1)
Sodium: 131 mmol/L — ABNORMAL LOW (ref 135–145)

## 2019-08-29 LAB — BRAIN NATRIURETIC PEPTIDE: B Natriuretic Peptide: 60.5 pg/mL (ref 0.0–100.0)

## 2019-08-29 LAB — CBC WITH DIFFERENTIAL/PLATELET
Abs Immature Granulocytes: 0.05 10*3/uL (ref 0.00–0.07)
Basophils Absolute: 0.1 10*3/uL (ref 0.0–0.1)
Basophils Relative: 0 %
Eosinophils Absolute: 0.1 10*3/uL (ref 0.0–0.5)
Eosinophils Relative: 1 %
HCT: 39.6 % (ref 39.0–52.0)
Hemoglobin: 13.1 g/dL (ref 13.0–17.0)
Immature Granulocytes: 0 %
Lymphocytes Relative: 22 %
Lymphs Abs: 2.7 10*3/uL (ref 0.7–4.0)
MCH: 30.1 pg (ref 26.0–34.0)
MCHC: 33.1 g/dL (ref 30.0–36.0)
MCV: 91 fL (ref 80.0–100.0)
Monocytes Absolute: 1 10*3/uL (ref 0.1–1.0)
Monocytes Relative: 8 %
Neutro Abs: 8.1 10*3/uL — ABNORMAL HIGH (ref 1.7–7.7)
Neutrophils Relative %: 69 %
Platelets: 202 10*3/uL (ref 150–400)
RBC: 4.35 MIL/uL (ref 4.22–5.81)
RDW: 11.9 % (ref 11.5–15.5)
WBC: 12 10*3/uL — ABNORMAL HIGH (ref 4.0–10.5)
nRBC: 0 % (ref 0.0–0.2)

## 2019-08-29 LAB — URINE DRUG SCREEN, QUALITATIVE (ARMC ONLY)
Amphetamines, Ur Screen: NOT DETECTED
Barbiturates, Ur Screen: NOT DETECTED
Benzodiazepine, Ur Scrn: NOT DETECTED
Cannabinoid 50 Ng, Ur ~~LOC~~: POSITIVE — AB
Cocaine Metabolite,Ur ~~LOC~~: NOT DETECTED
MDMA (Ecstasy)Ur Screen: NOT DETECTED
Methadone Scn, Ur: NOT DETECTED
Opiate, Ur Screen: NOT DETECTED
Phencyclidine (PCP) Ur S: NOT DETECTED
Tricyclic, Ur Screen: NOT DETECTED

## 2019-08-29 LAB — CK: Total CK: 131 U/L (ref 49–397)

## 2019-08-29 LAB — BLOOD GAS, VENOUS
Acid-base deficit: 1.9 mmol/L (ref 0.0–2.0)
Bicarbonate: 25.5 mmol/L (ref 20.0–28.0)
O2 Saturation: 37 %
Patient temperature: 37
pCO2, Ven: 53 mmHg (ref 44.0–60.0)
pH, Ven: 7.29 (ref 7.250–7.430)
pO2, Ven: <31 mmHg — CL (ref 32.0–45.0)

## 2019-08-29 LAB — SARS CORONAVIRUS 2 BY RT PCR (HOSPITAL ORDER, PERFORMED IN ~~LOC~~ HOSPITAL LAB): SARS Coronavirus 2: NEGATIVE

## 2019-08-29 LAB — TROPONIN I (HIGH SENSITIVITY)
Troponin I (High Sensitivity): 42 ng/L — ABNORMAL HIGH (ref <18)
Troponin I (High Sensitivity): 38 ng/L — ABNORMAL HIGH (ref <18)
Troponin I (High Sensitivity): 35 ng/L — ABNORMAL HIGH (ref <18)
Troponin I (High Sensitivity): 31 ng/L — ABNORMAL HIGH (ref <18)
Troponin I (High Sensitivity): 34 ng/L — ABNORMAL HIGH (ref <18)

## 2019-08-29 LAB — GLUCOSE, CAPILLARY
Glucose-Capillary: 399 mg/dL — ABNORMAL HIGH (ref 70–99)
Glucose-Capillary: 461 mg/dL — ABNORMAL HIGH (ref 70–99)
Glucose-Capillary: 181 mg/dL — ABNORMAL HIGH (ref 70–99)
Glucose-Capillary: 185 mg/dL — ABNORMAL HIGH (ref 70–99)

## 2019-08-29 LAB — LACTIC ACID, PLASMA
Lactic Acid, Venous: 1.5 mmol/L (ref 0.5–1.9)
Lactic Acid, Venous: 1.3 mmol/L (ref 0.5–1.9)

## 2019-08-29 LAB — HIV ANTIBODY (ROUTINE TESTING W REFLEX): HIV Screen 4th Generation wRfx: NONREACTIVE

## 2019-08-29 MED ORDER — ATORVASTATIN CALCIUM 20 MG PO TABS
40.0000 mg | ORAL_TABLET | Freq: Every day | ORAL | Status: DC
  Administered 2019-08-29 – 2019-08-31 (×3): 40 mg via ORAL
  Filled 2019-08-29 (×3): qty 2

## 2019-08-29 MED ORDER — INSULIN GLARGINE 100 UNIT/ML ~~LOC~~ SOLN
25.0000 [IU] | Freq: Every day | SUBCUTANEOUS | Status: DC
  Administered 2019-08-29 – 2019-08-31 (×3): 25 [IU] via SUBCUTANEOUS
  Filled 2019-08-29 (×5): qty 0.25

## 2019-08-29 MED ORDER — INSULIN ASPART 100 UNIT/ML ~~LOC~~ SOLN
0.0000 [IU] | Freq: Three times a day (TID) | SUBCUTANEOUS | Status: DC
  Administered 2019-08-29 (×2): 9 [IU] via SUBCUTANEOUS
  Administered 2019-08-29: 2 [IU] via SUBCUTANEOUS
  Administered 2019-08-30: 3 [IU] via SUBCUTANEOUS
  Administered 2019-08-30: 2 [IU] via SUBCUTANEOUS
  Administered 2019-08-30: 7 [IU] via SUBCUTANEOUS
  Administered 2019-08-31 (×2): 3 [IU] via SUBCUTANEOUS
  Filled 2019-08-29 (×8): qty 1

## 2019-08-29 MED ORDER — ACETAMINOPHEN 325 MG PO TABS: 650.0000 mg | ORAL_TABLET | Freq: Four times a day (QID) | ORAL | Status: DC | PRN

## 2019-08-29 MED ORDER — SODIUM CHLORIDE 0.9 % IV BOLUS
1000.0000 mL | Freq: Once | INTRAVENOUS | Status: AC
  Administered 2019-08-29: 1000 mL via INTRAVENOUS

## 2019-08-29 MED ORDER — SODIUM CHLORIDE 0.9 % IV SOLN: INTRAVENOUS | Status: DC

## 2019-08-29 MED ORDER — ASPIRIN EC 81 MG PO TBEC
81.0000 mg | DELAYED_RELEASE_TABLET | Freq: Every day | ORAL | Status: DC
  Administered 2019-08-29 – 2019-08-31 (×3): 81 mg via ORAL
  Filled 2019-08-29 (×3): qty 1

## 2019-08-29 MED ORDER — INSULIN ASPART 100 UNIT/ML ~~LOC~~ SOLN: 0.0000 [IU] | Freq: Every day | SUBCUTANEOUS | Status: DC

## 2019-08-29 MED ORDER — ONDANSETRON HCL 4 MG/2ML IJ SOLN: 4.0000 mg | Freq: Three times a day (TID) | INTRAMUSCULAR | Status: DC | PRN

## 2019-08-29 MED ORDER — ONDANSETRON HCL 4 MG/2ML IJ SOLN
4.0000 mg | Freq: Once | INTRAMUSCULAR | Status: AC
  Administered 2019-08-29: 4 mg via INTRAVENOUS
  Filled 2019-08-29: qty 2

## 2019-08-29 MED ORDER — HEPARIN SODIUM (PORCINE) 5000 UNIT/ML IJ SOLN
5000.0000 [IU] | Freq: Three times a day (TID) | INTRAMUSCULAR | Status: DC
  Administered 2019-08-29 – 2019-08-31 (×7): 5000 [IU] via SUBCUTANEOUS
  Filled 2019-08-29 (×7): qty 1

## 2019-08-29 MED ORDER — SODIUM ZIRCONIUM CYCLOSILICATE 5 G PO PACK
5.0000 g | PACK | Freq: Once | ORAL | Status: AC
  Administered 2019-08-29: 5 g via ORAL
  Filled 2019-08-29: qty 1

## 2019-08-29 NOTE — ED Triage Notes (Signed)
Pt reports awoke with dizziness, "tightness" in neck and nausea; pt vomiting in triage

## 2019-08-29 NOTE — ED Notes (Signed)
Pt again vomiting, generalized weakness noted; pt taken to room 6 for further evaluation

## 2019-08-29 NOTE — ED Provider Notes (Signed)
[facility] Emergency Department Provider Note  ____________________________________________   First MD Initiated Contact with Patient 08/29/19 [phone]     (approximate)  I have reviewed the triage vital signs and the nursing notes.   HISTORY  Chief Complaint No chief complaint on file.    HPI Terry [name] is a 44 y.o. male with below list of previous medical conditions including CAD CHF AICD atrial fibrillation presents to the emergency department cute onset of dizziness and neck tightness on awakening this morning. Patient also admits to nausea with 2 episodes of vomiting. Patient denies any abdominal pain. Patient denies any headache. Patient denies any weakness no numbness. Patient denies any chest pain no shortness of breath.       Past Medical History:  Diagnosis Date  . AICD (automatic cardioverter/defibrillator) present   . CHF (congestive heart failure) ([place])   . Coronary artery disease   . Diabetes mellitus without complication ([place])   . Dysrhythmia    AFIB  . Palpitations   . Presence of permanent cardiac pacemaker    PACEMAKER / DEFIB    Patient Active Problem List   Diagnosis Date Noted  . Acute kidney injury superimposed on chronic kidney disease ([place]) 08/29/2019    Past Surgical History:  Procedure Laterality Date  . ABLATION     CARDIAC  . CARDIAC DEFIBRILLATOR PLACEMENT    . CATARACT EXTRACTION W/PHACO Right 02/15/2017   Procedure: CATARACT EXTRACTION PHACO AND INTRAOCULAR LENS PLACEMENT (IOC);  Surgeon: Birder Robson, MD;  Location: [facility] ORS;  Service: Ophthalmology;  Laterality: Right;  [place] 00:48 AP% 14.8 CDE 7.11 Fluid pack lot # [id number]  . CATARACT EXTRACTION W/PHACO Left 03/15/2017   Procedure: CATARACT EXTRACTION PHACO AND INTRAOCULAR LENS PLACEMENT ([place]);  Surgeon: Birder Robson, MD;  Location: [facility] ORS;  Service: Ophthalmology;  Laterality: Left;  [place] 00:46.0 AP% 11.2 CDE 5.15 FLUID PACK LOT # [id number]  .  CORONARY ANGIOPLASTY    . INSERT / REPLACE / REMOVE PACEMAKER      Prior to Admission medications   Medication Sig Start Date End Date Taking? Authorizing Provider  aspirin EC 81 MG tablet Take 81 mg by mouth daily.    [provider]  atorvastatin (LIPITOR) 40 MG tablet Take 40 mg by mouth daily.    [provider]  hydrochlorothiazide (HYDRODIURIL) 25 MG tablet Take 25 mg by mouth daily.    [provider]  lisinopril (PRINIVIL,ZESTRIL) 10 MG tablet Take 10 mg by mouth daily.    [provider]    Allergies Patient has no known allergies.  No family history on file.  Social History Social History   Tobacco Use  . Smoking status: Never Smoker  . Smokeless tobacco: Never Used  Vaping Use  . Vaping Use: Never used  Substance Use Topics  . Alcohol use: No  . Drug use: No    Review of Systems Constitutional: No fever/chills Eyes: No visual changes. ENT: No sore throat. Cardiovascular: Denies chest pain. Respiratory: Denies shortness of breath. Gastrointestinal: No abdominal pain.  No nausea, no vomiting.  No diarrhea.  No constipation. Genitourinary: Negative for dysuria. Musculoskeletal: Negative for neck pain.  Negative for back pain. Integumentary: Negative for rash. Neurological: Negative for headaches, focal weakness or numbness.  Positive for dizziness (now resolved)  ____________________________________________   PHYSICAL EXAM:  VITAL SIGNS: ED Triage Vitals  Enc Vitals Group     BP 08/29/19 0440 111/79     Pulse Rate 08/29/19 0440 87  Resp 08/29/19 0440 18     Temp 08/29/19 0440 97.8 F (36.6 C)     Temp Source 08/29/19 0440 Oral     SpO2 08/29/19 0440 98 %     Weight 08/29/19 0437 (!) 140.6 kg (310 lb)     Height 08/29/19 0437 1.702 m (5\' 7" )     Head Circumference --      Peak Flow --      Pain Score 08/29/19 0437 6     Pain Loc --      Pain Edu? --      Excl. in [place]? --     Constitutional: Alert and  oriented.  Eyes: Conjunctivae are normal.  Mouth/Throat: Patient is wearing a mask. Neck: No stridor.  No meningeal signs.   Cardiovascular: Normal rate, regular rhythm. Good peripheral circulation. Grossly normal heart sounds. Respiratory: Normal respiratory effort.  No retractions. Gastrointestinal: Soft and nontender. No distention.  Musculoskeletal: No lower extremity tenderness nor edema. No gross deformities of extremities. Neurologic:  Normal speech and language. No gross focal neurologic deficits are appreciated.  Skin:  Skin is warm, dry and intact. Psychiatric: Mood and affect are normal. Speech and behavior are normal.  ____________________________________________   LABS (all labs ordered are listed, but only abnormal results are displayed)  Labs Reviewed  CBC WITH DIFFERENTIAL/PLATELET - Abnormal; Notable for the following components:      Result Value   WBC 12.0 (*)    Neutro Abs 8.1 (*)    All other components within normal limits  COMPREHENSIVE METABOLIC PANEL - Abnormal; Notable for the following components:   Sodium 133 (*)    CO2 21 (*)    Glucose, Bld 428 (*)    BUN 80 (*)    Creatinine, Ser 5.18 (*)    Calcium 8.5 (*)    GFR calc non Af Amer 12 (*)    GFR calc Af Amer 14 (*)    All other components within normal limits  BLOOD GAS, VENOUS - Abnormal; Notable for the following components:   pO2, Ven <31.0 (*)    All other components within normal limits  TROPONIN I (HIGH SENSITIVITY) - Abnormal; Notable for the following components:   Troponin I (High Sensitivity) 42 (*)    All other components within normal limits  TROPONIN I (HIGH SENSITIVITY) - Abnormal; Notable for the following components:   Troponin I (High Sensitivity) 38 (*)    All other components within normal limits  CULTURE, BLOOD (ROUTINE X 2)  CULTURE, BLOOD (ROUTINE X 2)  SARS CORONAVIRUS 2 BY RT PCR (HOSPITAL ORDER, [facility])  LACTIC ACID, PLASMA  LACTIC  ACID, PLASMA  CK   ____________________________________________  EKG  ED ECG REPORT I, [place] N Brynlei Klausner, the attending physician, personally viewed and interpreted this ECG.   Date: 08/29/2019  EKG Time: 4:38 AM  Rate: 89  Rhythm: Sinus rhythm  Axis: Normal  Intervals: Normal  ST&T Change: None  ____________________________________________  RADIOLOGY I, Spencer N Javid Kemler, personally viewed and evaluated these images (plain radiographs) as part of my medical decision making, as well as reviewing the written report by the radiologist.  ED MD interpretation: Negative CT head per radiologist.  Official radiology report(s): CT Head Wo Contrast  Result Date: 08/29/2019 CLINICAL DATA:  Nonspecific dizziness. EXAM: CT HEAD WITHOUT CONTRAST TECHNIQUE: Contiguous axial images were obtained from the base of the skull through the vertex without intravenous contrast. COMPARISON:  None. FINDINGS: Brain: No evidence of  acute infarction, hemorrhage, hydrocephalus, extra-axial collection or mass lesion/mass effect. Vascular: No hyperdense vessel or unexpected calcification. Skull: Normal. Negative for fracture or focal lesion. Sinuses/Orbits: No acute finding. IMPRESSION: Negative head CT. Electronically Signed   By: Monte Fantasia M.D.   On: 08/29/2019 06:25      Procedures   ____________________________________________   INITIAL IMPRESSION / MDM / [place] / ED COURSE  As part of my medical decision making, I reviewed the following data within the electronic MEDICAL RECORD NUMBER  44 year old male presented with above-stated history and physical exam differential diagnosis including but not limited to cardiac arrhythmia, CVA, cerebral aneurysm, hemorrhage, electrolyte abnormality, rhabdomyolysis, renal failure. Patient's laboratory data notable for a glucose of 428 with a creatinine of 5.8 BUN of 80 high-sensitivity troponin 42. Feel no acute intracranial abnormality per  radiologist. EKG revealed no evidence of arrhythmia ischemia or infarction. Given patient's history of CHF patient receiving IV fluids meticulously. Review of the patient's chart [facility] revealed that his creatinine was 2.14 with a BUN of 25 on 07/25/2019. Patient was noted to be hypotensive however improving with IV fluids. Patient discussed with Dr. Damita Dunnings for hospital admission further evaluation and management. Dr. Damita Dunnings notified that CK pending  ____________________________________________  FINAL CLINICAL IMPRESSION(S) / ED DIAGNOSES  Final diagnoses:  Hyperglycemia  Acute kidney injury ([place])     MEDICATIONS GIVEN DURING THIS VISIT:  Medications  ondansetron (ZOFRAN) injection 4 mg (4 mg Intravenous Given 08/29/19 0448)  sodium chloride 0.9 % bolus 1,000 mL (1,000 mLs Intravenous New Bag/Given 08/29/19 0533)     ED Discharge Orders    None      *Please note:  Terry [name] was evaluated in Emergency Department on 08/29/2019 for the symptoms described in the history of present illness. He was evaluated in the context of the global COVID-19 pandemic, which necessitated consideration that the patient might be at risk for infection with the SARS-CoV-2 virus that causes COVID-19. Institutional protocols and algorithms that pertain to the evaluation of patients at risk for COVID-19 are in a state of rapid change based on information released by regulatory bodies including the CDC and federal and state organizations. These policies and algorithms were followed during the patient's care in the ED.  Some ED evaluations and interventions may be delayed as a result of limited staffing during and after the pandemic.*  Note:  This document was prepared using Dragon voice recognition software and may include unintentional dictation errors.   Gregor Hams, MD 08/29/19 [phone]

## 2019-08-29 NOTE — H&P (Addendum)
History and Physical    Terry [name] WVP:[id number] DOB: Jan 29, 1976 DOA: 08/29/2019  Referring MD/NP/PA:   PCP: System, Pcp Not In   Patient coming from:  The patient is coming from home.  At baseline, pt is independent for most of ADL.        Chief Complaint: Nausea, vomiting, dizziness  HPI: Terry [name] is a 44 y.o. male with medical history significant of hypertension, hyperlipidemia, diabetes mellitus, AICD, atrial fibrillation not on anticoagulants, OSA, CKD-4, dCHF, CAD, who presents with nausea, vomiting and dizziness.  Patient states that he had worked outside yesterday.  He developed nausea and vomiting.  He has had nonbilious nonbloody vomiting 4-5 times.  Patient denies diarrhea and abdominal pain.  No fever or chills.  Patient states that his nausea and vomiting have resolved.  Currently no nausea vomiting, diarrhea or abdominal pain.  Patient feels dizziness and lightheadedness.  Patient denies chest pain, shortness breath, cough.  No symptoms of UTI.  Patient states that he has history of diabetes, and is about to start diabetic medications this week, but has not yet.  Patient had hypotension initially with blood pressure 83/50, which improved to 110/71 after giving 2 L normal saline bolus in the ED.  ED Course: pt was found to have worsening renal function with creatinine 2.14 on 07/25/2019 up to 5.18, BUN 80, negative urinalysis, WBC 12.0, lactic acid 1.5, 1.3, troponin 42, 38, CK 131, BNP 60.5, negative COVID-19 PCR, VBG with pH of 7.29, CO2 53, O2<31. Blood sugar 428, anion gap 14, temperature normal, heart rate 85, RR 14, oxygen saturation 96% on room air.  CT head negative.  Patient is placed on MedSurg bed for observation.  Review of Systems:   General: no fevers, chills, no body weight gain, has poor appetite, has fatigue HEENT: no blurry vision, hearing changes or sore throat Respiratory: no dyspnea, coughing, wheezing CV: no chest pain, no palpitations GI:  has nausea, vomiting, no abdominal pain, diarrhea, constipation GU: no dysuria, burning on urination, increased urinary frequency, hematuria  Ext: has leg edema Neuro: no unilateral weakness, numbness, or tingling, no vision change or hearing loss. Has dizziness. Skin: no rash, no skin tear. MSK: No muscle spasm, no deformity, no limitation of range of movement in spin Heme: No easy bruising.  Travel history: No recent long distant travel.  Allergy: No Known Allergies  Past Medical History:  Diagnosis Date  . AICD (automatic cardioverter/defibrillator) present   . CHF (congestive heart failure) ([place])   . Coronary artery disease   . Diabetes mellitus without complication ([place])   . Dysrhythmia    AFIB  . Palpitations   . Presence of permanent cardiac pacemaker    PACEMAKER / DEFIB    Past Surgical History:  Procedure Laterality Date  . ABLATION     CARDIAC  . CARDIAC DEFIBRILLATOR PLACEMENT    . CATARACT EXTRACTION W/PHACO Right 02/15/2017   Procedure: CATARACT EXTRACTION PHACO AND INTRAOCULAR LENS PLACEMENT (IOC);  Surgeon: Birder Robson, MD;  Location: [facility] ORS;  Service: Ophthalmology;  Laterality: Right;  [place] 00:48 AP% 14.8 CDE 7.11 Fluid pack lot # [id number]  . CATARACT EXTRACTION W/PHACO Left 03/15/2017   Procedure: CATARACT EXTRACTION PHACO AND INTRAOCULAR LENS PLACEMENT ([place]);  Surgeon: Birder Robson, MD;  Location: [facility] ORS;  Service: Ophthalmology;  Laterality: Left;  [place] 00:46.0 AP% 11.2 CDE 5.15 FLUID PACK LOT # [id number]  . CORONARY ANGIOPLASTY    . INSERT / REPLACE / REMOVE PACEMAKER  Social History:  reports that he has never smoked. He has never used smokeless tobacco. He reports that he does not drink alcohol and does not use drugs.  Family History:  Family History  Problem Relation Age of Onset  . Ovarian cancer Mother   . Diabetes Mellitus II Father   . Heart disease Father   . Hypertension Father      Prior to Admission medications     Medication Sig Start Date End Date Taking? Authorizing Provider  atorvastatin (LIPITOR) 40 MG tablet Take 40 mg by mouth daily.   Yes [provider]  hydrochlorothiazide (HYDRODIURIL) 25 MG tablet Take 25 mg by mouth daily.   Yes [provider]  lisinopril (ZESTRIL) 40 MG tablet Take 40 mg by mouth daily.    Yes [provider]    Physical Exam: Vitals:   08/29/19 0730 08/29/19 0830 08/29/19 0900 08/29/19 0930  BP: 110/71 113/72 126/78 123/78  Pulse: 85 79 81 80  Resp: 14 13 18 17   Temp:      TempSrc:      SpO2: 99% 100% 99% 98%  Weight:      Height:       General: Not in acute distress HEENT:       Eyes: PERRL, EOMI, no scleral icterus.       ENT: No discharge from the ears and nose, no pharynx injection, no tonsillar enlargement.        Neck: No JVD, no bruit, no mass felt. Heme: No neck lymph node enlargement. Cardiac: S1/S2, RRR, No murmurs, No gallops or rubs. Respiratory: No rales, wheezing, rhonchi or rubs. GI: Soft, nondistended, nontender, no rebound pain, no organomegaly, BS present. GU: No hematuria Ext: trace leg edema bilaterally. 1+DP/PT pulse bilaterally. Musculoskeletal: No joint deformities, No joint redness or warmth, no limitation of ROM in spin. Skin: No rashes.  Neuro: Alert, oriented X3, cranial nerves II-XII grossly intact, moves all extremities normally. Muscle strength 5/5 in all extremities, sensation to light touch intact. Psych: Patient is not psychotic, no suicidal or hemocidal ideation.  Labs on Admission: I have personally reviewed following labs and imaging studies  CBC: Recent Labs  Lab 08/29/19 0452  WBC 12.0*  NEUTROABS 8.1*  HGB 13.1  HCT 39.6  MCV 91.0  PLT 662   Basic Metabolic Panel: Recent Labs  Lab 08/29/19 0452  NA 133*  K 4.5  CL 98  CO2 21*  GLUCOSE 428*  BUN 80*  CREATININE 5.18*  CALCIUM 8.5*   GFR: Estimated Creatinine Clearance: 24.2 mL/min (A) (by C-G formula based on SCr of  5.18 mg/dL (H)). Liver Function Tests: Recent Labs  Lab 08/29/19 0452  AST 17  ALT 11  ALKPHOS 92  BILITOT 0.9  PROT 6.9  ALBUMIN 3.5   No results for input(s): LIPASE, AMYLASE in the last 168 hours. No results for input(s): AMMONIA in the last 168 hours. Coagulation Profile: No results for input(s): INR, PROTIME in the last 168 hours. Cardiac Enzymes: Recent Labs  Lab 08/29/19 0452  CKTOTAL 131   BNP (last 3 results) No results for input(s): PROBNP in the last 8760 hours. HbA1C: No results for input(s): HGBA1C in the last 72 hours. CBG: Recent Labs  Lab 08/29/19 0844  GLUCAP 399*   Lipid Profile: No results for input(s): CHOL, HDL, LDLCALC, TRIG, CHOLHDL, LDLDIRECT in the last 72 hours. Thyroid Function Tests: No results for input(s): TSH, T4TOTAL, FREET4, T3FREE, THYROIDAB in the last 72 hours. Anemia Panel: No  results for input(s): VITAMINB12, FOLATE, FERRITIN, TIBC, IRON, RETICCTPCT in the last 72 hours. Urine analysis:    Component Value Date/Time   COLORURINE YELLOW (A) 08/29/2019 0900   APPEARANCEUR CLEAR (A) 08/29/2019 0900   LABSPEC 1.013 08/29/2019 0900   PHURINE 5.0 08/29/2019 0900   GLUCOSEU >=500 (A) 08/29/2019 0900   HGBUR NEGATIVE 08/29/2019 0900   BILIRUBINUR NEGATIVE 08/29/2019 0900   KETONESUR NEGATIVE 08/29/2019 0900   PROTEINUR NEGATIVE 08/29/2019 0900   NITRITE NEGATIVE 08/29/2019 0900   LEUKOCYTESUR NEGATIVE 08/29/2019 0900   Sepsis Labs: @LABRCNTIP (procalcitonin:4,lacticidven:4) ) Recent Results (from the past 240 hour(s))  Blood culture (routine x 2)     Status: None (Preliminary result)   Collection Time: 08/29/19  6:21 AM   Specimen: BLOOD  Result Value Ref Range Status   Specimen Description BLOOD RAC  Final   Special Requests BOTTLES DRAWN AEROBIC AND ANAEROBIC BCAV  Final   Culture   Final    NO GROWTH <12 HOURS Performed at [facility], [address], [place], [place] [address]    Report Status PENDING   Incomplete  Blood culture (routine x 2)     Status: None (Preliminary result)   Collection Time: 08/29/19  6:21 AM   Specimen: BLOOD  Result Value Ref Range Status   Specimen Description BLOOD LAC  Final   Special Requests BOTTLES DRAWN AEROBIC AND ANAEROBIC BCAV  Final   Culture   Final    NO GROWTH <12 HOURS Performed at [facility], [address] [place], [place] [address]    Report Status PENDING  Incomplete  SARS Coronavirus 2 by RT PCR (hospital order, performed in [place] hospital lab) Nasopharyngeal Nasopharyngeal Swab     Status: None   Collection Time: 08/29/19  7:09 AM   Specimen: Nasopharyngeal Swab  Result Value Ref Range Status   SARS Coronavirus 2 NEGATIVE NEGATIVE Final    Comment: (NOTE) SARS-CoV-2 target nucleic acids are NOT DETECTED.  The SARS-CoV-2 RNA is generally detectable in upper and lower respiratory specimens during the acute phase of infection. The lowest concentration of SARS-CoV-2 viral copies this assay can detect is 250 copies / mL. A negative result does not preclude SARS-CoV-2 infection and should not be used as the sole basis for treatment or other patient management decisions.  A negative result may occur with improper specimen collection / handling, submission of specimen other than nasopharyngeal swab, presence of viral mutation(s) within the areas targeted by this assay, and inadequate number of viral copies (<250 copies / mL). A negative result must be combined with clinical observations, patient history, and epidemiological information.  Fact Sheet for Patients:   StrictlyIdeas.no  Fact Sheet for Healthcare Providers: BankingDealers.co.za  This test is not yet approved or  cleared by the [place] FDA and has been authorized for detection and/or diagnosis of SARS-CoV-2 by FDA under an Emergency Use Authorization (EUA).  This EUA will remain in effect (meaning this test  can be used) for the duration of the COVID-19 declaration under Section 564(b)(1) of the Act, 21 U.S.C. section 360bbb-3(b)(1), unless the authorization is terminated or revoked sooner.  Performed at [facility], [address], [place], [place] [address]      Radiological Exams on Admission: CT Head Wo Contrast  Result Date: 08/29/2019 CLINICAL DATA:  Nonspecific dizziness. EXAM: CT HEAD WITHOUT CONTRAST TECHNIQUE: Contiguous axial images were obtained from the base of the skull through the vertex without intravenous contrast. COMPARISON:  None. FINDINGS:  Brain: No evidence of acute infarction, hemorrhage, hydrocephalus, extra-axial collection or mass lesion/mass effect. Vascular: No hyperdense vessel or unexpected calcification. Skull: Normal. Negative for fracture or focal lesion. Sinuses/Orbits: No acute finding. IMPRESSION: Negative head CT. Electronically Signed   By: Monte Fantasia M.D.   On: 08/29/2019 06:25   US RENAL  Result Date: 08/29/2019 CLINICAL DATA:  Acute kidney injury EXAM: RENAL / URINARY TRACT ULTRASOUND COMPLETE COMPARISON:  07/30/2008 FINDINGS: Right Kidney: Renal measurements: 11.3 by 5.9 x 5.9 cm. = volume: 207.72. mL. Diffuse cortical thinning noted. Echogenicity within normal limits. No mass or hydronephrosis visualized. Left Kidney: Renal measurements: 10.4 by 5.9 x 5.1 cm. = volume: 162.68 mL. Diffuse cortical thinning noted. Echogenicity within normal limits. No mass or hydronephrosis visualized. Bladder: Appears normal for degree of bladder distention. Bilateral ureteral jets not visualized. Other: None. IMPRESSION: 1. No mass or hydronephrosis identified. 2. Bilateral renal cortical thinning. Electronically Signed   By: Kerby Moors M.D.   On: 08/29/2019 10:00     EKG: Independently reviewed. Sinus rhythm, QTC 467, LAE, poor R wave progression, T wave inversion in lead I/aVL.  Assessment/Plan Principal Problem:   Acute renal failure superimposed  on stage 4 chronic kidney disease (HCC) Active Problems:   Coronary artery disease   Chronic diastolic CHF (congestive heart failure) (HCC)   Atrial fibrillation, chronic (HCC)   Type II diabetes mellitus with renal manifestations (HCC)   Leukocytosis   Hypotension   Elevated troponin   Nausea & vomiting    Acute renal failure superimposed on stage 4 chronic kidney disease ([place]): Baseline Cre is 2.14 on 07/25/19, pt's Cre is 5.18 and BUN 80 on admission. Likely due to dehydration and continuation of ACEI and diuretics,. CK 131.  -will place on med-surg bed for obs, - IVF: 2L NS, then 75 cc/h - Follow up renal function by BMP - Avoid using renal toxic medications, hypotension and contrast dye (or carefully use) - Hold HCTZ and lisinopril - US-renal  Coronary artery disease and elevated trop: trop 42 -->39. No CP or SOB. Likely demand ischemia and decreased clearance secondary to worsening renal function -Continue aspirin, Lipitor -Trend troponin -Check A1c, FLP -Repeat EKG in morning -Check UDS  Chronic diastolic CHF (congestive heart failure) ([place]): 2D echo on 01/23/2017 showed EF of 50%.  Patient has 1+ leg edema, but no respiratory distress.  BNP 60.5.  CHF seems to be compensated. -Hold HCTZ  Atrial fibrillation, chronic ([place]): Heart rate 85. CHA2DS2-VASc Score is 4, needs oral anticoagulation, but pt is on AC at home. Currently patient is in sinus rhythm -Cardiac monitoring -Need to follow-up with PCP and cardiology about possibly starting anticoagulants.  Type II diabetes mellitus with renal manifestations ([place]): Most recent A1c 8.7 on 06/08/19, poorly controled. Patient is not taking meds at home -will start Lantus 25 units daily -SSI  Hypotension: Initial blood pressure 83/50, which improved to 110/71 after giving 2 L normal saline bolus in ED.  Possibly due to dehydration.  Patient has mild leukocytosis with WBC 12.0, but no fever.  No source of infection identified.   Urinalysis negative.  No respiratory symptoms.  Clinically does not seem to have sepsis. -Continue IV fluids as above -Hold blood pressure medications as above  Nausea & vomiting: Etiology is not clear.  Symptoms have resolved currently. -As needed Zofran -IV fluid as above  Leukocytosis: WBC 12.0.  No source of infection identified.  Possibly reactive. -Follow-up with CBC -If patient develops fever, will start antibiotics.  DVT ppx: SQ Heparin    Code Status: Full code Family Communication: Ellene Route is at the bedside Disposition Plan:  Anticipate discharge back to previous environment Consults called:  None Admission status: Med-surg bed for obs          Status is: Observation  The patient remains OBS appropriate and will d/c before 2 midnights.  Dispo: The patient is from: Home              Anticipated d/c is to: Home              Anticipated d/c date is: 1 day              Patient currently is not medically stable to d/c.          Date of Service 08/29/2019    Ivor Costa [facility]   If 7PM-7AM, please contact night-coverage [email or web address] 08/29/2019, 10:04 AM

## 2019-08-30 DIAGNOSIS — E1129 Type 2 diabetes mellitus with other diabetic kidney complication: Secondary | ICD-10-CM

## 2019-08-30 DIAGNOSIS — I959 Hypotension, unspecified: Secondary | ICD-10-CM | POA: Diagnosis present

## 2019-08-30 DIAGNOSIS — I482 Chronic atrial fibrillation, unspecified: Secondary | ICD-10-CM | POA: Diagnosis present

## 2019-08-30 DIAGNOSIS — E1165 Type 2 diabetes mellitus with hyperglycemia: Secondary | ICD-10-CM | POA: Diagnosis present

## 2019-08-30 DIAGNOSIS — N179 Acute kidney failure, unspecified: Secondary | ICD-10-CM | POA: Diagnosis present

## 2019-08-30 DIAGNOSIS — I5032 Chronic diastolic (congestive) heart failure: Secondary | ICD-10-CM | POA: Diagnosis present

## 2019-08-30 DIAGNOSIS — I2510 Atherosclerotic heart disease of native coronary artery without angina pectoris: Secondary | ICD-10-CM | POA: Diagnosis present

## 2019-08-30 DIAGNOSIS — Z7982 Long term (current) use of aspirin: Secondary | ICD-10-CM | POA: Diagnosis not present

## 2019-08-30 DIAGNOSIS — G4733 Obstructive sleep apnea (adult) (pediatric): Secondary | ICD-10-CM | POA: Diagnosis present

## 2019-08-30 DIAGNOSIS — Z95810 Presence of automatic (implantable) cardiac defibrillator: Secondary | ICD-10-CM | POA: Diagnosis not present

## 2019-08-30 DIAGNOSIS — E1122 Type 2 diabetes mellitus with diabetic chronic kidney disease: Secondary | ICD-10-CM | POA: Diagnosis present

## 2019-08-30 DIAGNOSIS — Z833 Family history of diabetes mellitus: Secondary | ICD-10-CM | POA: Diagnosis not present

## 2019-08-30 DIAGNOSIS — D72829 Elevated white blood cell count, unspecified: Secondary | ICD-10-CM | POA: Diagnosis present

## 2019-08-30 DIAGNOSIS — I130 Hypertensive heart and chronic kidney disease with heart failure and stage 1 through stage 4 chronic kidney disease, or unspecified chronic kidney disease: Secondary | ICD-10-CM | POA: Diagnosis present

## 2019-08-30 DIAGNOSIS — I248 Other forms of acute ischemic heart disease: Secondary | ICD-10-CM | POA: Diagnosis present

## 2019-08-30 DIAGNOSIS — E785 Hyperlipidemia, unspecified: Secondary | ICD-10-CM | POA: Diagnosis present

## 2019-08-30 DIAGNOSIS — Z79899 Other long term (current) drug therapy: Secondary | ICD-10-CM | POA: Diagnosis not present

## 2019-08-30 DIAGNOSIS — Z8249 Family history of ischemic heart disease and other diseases of the circulatory system: Secondary | ICD-10-CM | POA: Diagnosis not present

## 2019-08-30 DIAGNOSIS — Z8041 Family history of malignant neoplasm of ovary: Secondary | ICD-10-CM | POA: Diagnosis not present

## 2019-08-30 DIAGNOSIS — N170 Acute kidney failure with tubular necrosis: Secondary | ICD-10-CM | POA: Diagnosis present

## 2019-08-30 DIAGNOSIS — T502X5A Adverse effect of carbonic-anhydrase inhibitors, benzothiadiazides and other diuretics, initial encounter: Secondary | ICD-10-CM | POA: Diagnosis present

## 2019-08-30 DIAGNOSIS — Z20822 Contact with and (suspected) exposure to covid-19: Secondary | ICD-10-CM | POA: Diagnosis present

## 2019-08-30 DIAGNOSIS — R739 Hyperglycemia, unspecified: Secondary | ICD-10-CM | POA: Diagnosis present

## 2019-08-30 DIAGNOSIS — N184 Chronic kidney disease, stage 4 (severe): Secondary | ICD-10-CM | POA: Diagnosis present

## 2019-08-30 DIAGNOSIS — E860 Dehydration: Secondary | ICD-10-CM | POA: Diagnosis present

## 2019-08-30 DIAGNOSIS — Z6841 Body Mass Index (BMI) 40.0 and over, adult: Secondary | ICD-10-CM | POA: Diagnosis not present

## 2019-08-30 LAB — BASIC METABOLIC PANEL
Anion gap: 10 (ref 5–15)
BUN: 67 mg/dL — ABNORMAL HIGH (ref 6–20)
CO2: 25 mmol/L (ref 22–32)
Calcium: 8.2 mg/dL — ABNORMAL LOW (ref 8.9–10.3)
Chloride: 103 mmol/L (ref 98–111)
Creatinine, Ser: 3.73 mg/dL — ABNORMAL HIGH (ref 0.61–1.24)
GFR calc Af Amer: 21 mL/min — ABNORMAL LOW (ref >60)
GFR calc non Af Amer: 18 mL/min — ABNORMAL LOW (ref >60)
Glucose, Bld: 285 mg/dL — ABNORMAL HIGH (ref 70–99)
Potassium: 4.2 mmol/L (ref 3.5–5.1)
Sodium: 138 mmol/L (ref 135–145)

## 2019-08-30 LAB — LIPID PANEL
Cholesterol: 124 mg/dL (ref 0–200)
HDL: 27 mg/dL — ABNORMAL LOW (ref >40)
LDL Cholesterol: 70 mg/dL (ref 0–99)
Total CHOL/HDL Ratio: 4.6 RATIO
Triglycerides: 134 mg/dL (ref <150)
VLDL: 27 mg/dL (ref 0–40)

## 2019-08-30 LAB — GLUCOSE, CAPILLARY
Glucose-Capillary: 227 mg/dL — ABNORMAL HIGH (ref 70–99)
Glucose-Capillary: 316 mg/dL — ABNORMAL HIGH (ref 70–99)
Glucose-Capillary: 184 mg/dL — ABNORMAL HIGH (ref 70–99)
Glucose-Capillary: 153 mg/dL — ABNORMAL HIGH (ref 70–99)

## 2019-08-30 LAB — CBC
HCT: 37.4 % — ABNORMAL LOW (ref 39.0–52.0)
Hemoglobin: 12.2 g/dL — ABNORMAL LOW (ref 13.0–17.0)
MCH: 30.1 pg (ref 26.0–34.0)
MCHC: 32.6 g/dL (ref 30.0–36.0)
MCV: 92.3 fL (ref 80.0–100.0)
Platelets: 173 10*3/uL (ref 150–400)
RBC: 4.05 MIL/uL — ABNORMAL LOW (ref 4.22–5.81)
RDW: 11.8 % (ref 11.5–15.5)
WBC: 8.3 10*3/uL (ref 4.0–10.5)
nRBC: 0 % (ref 0.0–0.2)

## 2019-08-30 LAB — HEMOGLOBIN A1C
Hgb A1c MFr Bld: 11.5 % — ABNORMAL HIGH (ref 4.8–5.6)
Mean Plasma Glucose: 283.35 mg/dL

## 2019-08-30 MED ORDER — LIVING WELL WITH DIABETES BOOK
Freq: Once | Status: AC
  Filled 2019-08-30: qty 1

## 2019-08-30 MED ORDER — INSULIN STARTER KIT- PEN NEEDLES (ENGLISH)
1.0000 | Freq: Once | Status: AC
  Administered 2019-08-31: 1
  Filled 2019-08-30: qty 1

## 2019-08-30 NOTE — Progress Notes (Signed)
PROGRESS NOTE    Terry [name]  NTI:[id number] DOB: [birth year] DOA: 08/29/2019 PCP: System, Pcp Not In   Assessment & Plan:   Principal Problem:   Acute renal failure superimposed on stage 4 chronic kidney disease ([place]) Active Problems:   Coronary artery disease   Chronic diastolic CHF (congestive heart failure) (HCC)   Atrial fibrillation, chronic (HCC)   Type II diabetes mellitus with renal manifestations (HCC)   Leukocytosis   Hypotension   Elevated troponin   Nausea & vomiting  AKI on CKDIV: baseline Cr is 2.14. Likely secondary to dehydration, lisinopril and HCTZ use. Cr is trending down from day prior. Will continue to monitor   CAD: w/ elevated troponins. Likely secondary to demand ischemia & AKI on CKD.  Continue aspirin, statin.   Chronic diastolic CHF: w/o exacerbation. Hold home ACE-I. Not on lasix or beta blocker at home   Chronic a. fib: rate controlled currently. CHA2DS2-VASc Score is 4, needs oral anticoagulation, but unsure why pt is not on anticoagulation. Need to follow-up with PCP &  cardiology about possibly starting anticoagulant  DM2: Hba1c 11.5, poorly controled. Continue on lantus & SSI w/ accuchecks. DM coordinator consulted. Will need to see endocrinology as an outpatient  Hypotension: resolved  Nausea & vomiting: resolved. Zofran prn   Leukocytosis: likely reactive. Will continue to monitor   Morbidly obese: BMI 48.5. Complicates above stated care. Would benefit from significant weight loss  DVT prophylaxis: heparin  Code Status: full  Family Communication:  Disposition Plan: likely d/c back home    Consultants:      Procedures:    Antimicrobials:    Subjective: Pt c/o malaise   Objective: Vitals:   08/29/19 1945 08/29/19 2024 08/30/19 0038 08/30/19 0412  BP:  (!) 153/98 (!) 140/90 116/68  Pulse:  91 78 77  Resp:  18 16 18   Temp: 97.8 F (36.6 C) 98.1 F (36.7 C) 98 F (36.7 C) 97.8 F (36.6 C)  TempSrc: Oral  Oral Oral Oral  SpO2:  100% 99% 100%  Weight:      Height:        Intake/Output Summary (Last 24 hours) at 08/30/2019 0819 Last data filed at 08/29/2019 1117 Gross per 24 hour  Intake 2000 ml  Output 510 ml  Net 1490 ml   Filed Weights   08/29/19 0437  Weight: (!) 140.6 kg    Examination:  General exam: Appears calm and comfortable  Respiratory system: decreased breath sounds b/l Cardiovascular system: S1 & S2+. Norubs, gallops or clicks.  Gastrointestinal system: Abdomen is obese, soft and nontender. Hypoactive bowel sounds heard. Central nervous system: Alert and oriented. Moves all 4 extremities  Psychiatry: Judgement and insight appear normal. Mood & affect appropriate.     Data Reviewed: I have personally reviewed following labs and imaging studies  CBC: Recent Labs  Lab 08/29/19 0452 08/30/19 0519  WBC 12.0* 8.3  NEUTROABS 8.1*  --   HGB 13.1 12.2*  HCT 39.6 37.4*  MCV 91.0 92.3  PLT 202 867   Basic Metabolic Panel: Recent Labs  Lab 08/29/19 0452 08/29/19 0942 08/30/19 0519  NA 133* 131* 138  K 4.5 5.2* 4.2  CL 98 98 103  CO2 21* 22 25  GLUCOSE 428* 432* 285*  BUN 80* 80* 67*  CREATININE 5.18* 4.88* 3.73*  CALCIUM 8.5* 8.7* 8.2*   GFR: Estimated Creatinine Clearance: 33.6 mL/min (A) (by C-G formula based on SCr of 3.73 mg/dL (H)). Liver Function Tests: Recent Labs  Lab 08/29/19 0452  AST 17  ALT 11  ALKPHOS 92  BILITOT 0.9  PROT 6.9  ALBUMIN 3.5   No results for input(s): LIPASE, AMYLASE in the last 168 hours. No results for input(s): AMMONIA in the last 168 hours. Coagulation Profile: No results for input(s): INR, PROTIME in the last 168 hours. Cardiac Enzymes: Recent Labs  Lab 08/29/19 0452  CKTOTAL 131   BNP (last 3 results) No results for input(s): PROBNP in the last 8760 hours. HbA1C: No results for input(s): HGBA1C in the last 72 hours. CBG: Recent Labs  Lab 08/29/19 0844 08/29/19 1106 08/29/19 1735 08/29/19 2137    GLUCAP 399* 461* 181* 185*   Lipid Profile: Recent Labs    08/30/19 0519  CHOL 124  HDL 27*  LDLCALC 70  TRIG 134  CHOLHDL 4.6   Thyroid Function Tests: No results for input(s): TSH, T4TOTAL, FREET4, T3FREE, THYROIDAB in the last 72 hours. Anemia Panel: No results for input(s): VITAMINB12, FOLATE, FERRITIN, TIBC, IRON, RETICCTPCT in the last 72 hours. Sepsis Labs: Recent Labs  Lab 08/29/19 0388 08/29/19 0708  LATICACIDVEN 1.5 1.3    Recent Results (from the past 240 hour(s))  Blood culture (routine x 2)     Status: None (Preliminary result)   Collection Time: 08/29/19  6:21 AM   Specimen: BLOOD  Result Value Ref Range Status   Specimen Description BLOOD RAC  Final   Special Requests BOTTLES DRAWN AEROBIC AND ANAEROBIC BCAV  Final   Culture   Final    NO GROWTH 1 DAY Performed at [facility], [address] [place], [place] [address]    Report Status PENDING  Incomplete  Blood culture (routine x 2)     Status: None (Preliminary result)   Collection Time: 08/29/19  6:21 AM   Specimen: BLOOD  Result Value Ref Range Status   Specimen Description BLOOD LAC  Final   Special Requests BOTTLES DRAWN AEROBIC AND ANAEROBIC BCAV  Final   Culture   Final    NO GROWTH 1 DAY Performed at [facility], [address], [place], [place] [address]    Report Status PENDING  Incomplete  SARS Coronavirus 2 by RT PCR (hospital order, performed in [place] hospital lab) Nasopharyngeal Nasopharyngeal Swab     Status: None   Collection Time: 08/29/19  7:09 AM   Specimen: Nasopharyngeal Swab  Result Value Ref Range Status   SARS Coronavirus 2 NEGATIVE NEGATIVE Final    Comment: (NOTE) SARS-CoV-2 target nucleic acids are NOT DETECTED.  The SARS-CoV-2 RNA is generally detectable in upper and lower respiratory specimens during the acute phase of infection. The lowest concentration of SARS-CoV-2 viral copies this assay can detect is 250 copies / mL. A negative  result does not preclude SARS-CoV-2 infection and should not be used as the sole basis for treatment or other patient management decisions.  A negative result may occur with improper specimen collection / handling, submission of specimen other than nasopharyngeal swab, presence of viral mutation(s) within the areas targeted by this assay, and inadequate number of viral copies (<250 copies / mL). A negative result must be combined with clinical observations, patient history, and epidemiological information.  Fact Sheet for Patients:   StrictlyIdeas.no  Fact Sheet for Healthcare Providers: BankingDealers.co.za  This test is not yet approved or  cleared by the [place] FDA and has been authorized for detection and/or diagnosis of SARS-CoV-2 by FDA under an Emergency Use Authorization (EUA).  This EUA  will remain in effect (meaning this test can be used) for the duration of the COVID-19 declaration under Section 564(b)(1) of the Act, 21 U.S.C. section 360bbb-3(b)(1), unless the authorization is terminated or revoked sooner.  Performed at [facility], [address] [place], [place] [address]          Radiology Studies: CT Head Wo Contrast  Result Date: 08/29/2019 CLINICAL DATA:  Nonspecific dizziness. EXAM: CT HEAD WITHOUT CONTRAST TECHNIQUE: Contiguous axial images were obtained from the base of the skull through the vertex without intravenous contrast. COMPARISON:  None. FINDINGS: Brain: No evidence of acute infarction, hemorrhage, hydrocephalus, extra-axial collection or mass lesion/mass effect. Vascular: No hyperdense vessel or unexpected calcification. Skull: Normal. Negative for fracture or focal lesion. Sinuses/Orbits: No acute finding. IMPRESSION: Negative head CT. Electronically Signed   By: Monte Fantasia M.D.   On: 08/29/2019 06:25   US RENAL  Result Date: 08/29/2019 CLINICAL DATA:  Acute kidney injury EXAM:  RENAL / URINARY TRACT ULTRASOUND COMPLETE COMPARISON:  07/30/2008 FINDINGS: Right Kidney: Renal measurements: 11.3 by 5.9 x 5.9 cm. = volume: 207.72. mL. Diffuse cortical thinning noted. Echogenicity within normal limits. No mass or hydronephrosis visualized. Left Kidney: Renal measurements: 10.4 by 5.9 x 5.1 cm. = volume: 162.68 mL. Diffuse cortical thinning noted. Echogenicity within normal limits. No mass or hydronephrosis visualized. Bladder: Appears normal for degree of bladder distention. Bilateral ureteral jets not visualized. Other: None. IMPRESSION: 1. No mass or hydronephrosis identified. 2. Bilateral renal cortical thinning. Electronically Signed   By: Kerby Moors M.D.   On: 08/29/2019 10:00        Scheduled Meds: . aspirin EC  81 mg Oral Daily  . atorvastatin  40 mg Oral Daily  . heparin  5,000 Units Subcutaneous Q8H  . insulin aspart  0-5 Units Subcutaneous QHS  . insulin aspart  0-9 Units Subcutaneous TID WC  . insulin glargine  25 Units Subcutaneous Daily   Continuous Infusions: . sodium chloride 75 mL/hr at 08/29/19 2339     LOS: 0 days    Time spent: 35 mins    Wyvonnia Dusky, MD [facility] Pager [phone]  If 7PM-7AM, please contact night-coverage [email or web address] 08/30/2019, 8:19 AM

## 2019-08-30 NOTE — Progress Notes (Addendum)
Pt had 27 beats run of VT. Opyd-MD notified via epic txt. Waiting for response.

## 2019-08-30 NOTE — Progress Notes (Signed)
Ch visited with Pt as part of routine rounding. Pt was on the phone at this time. Ch introduced self, and Pt reported that he was in because of dehydration, and weakness. Pt reported that they have been taking good care of him here. Pt did not want any other support from chaplains at this time. Ch let pt know about anytime chaplain availability before leaving.

## 2019-08-30 NOTE — Progress Notes (Signed)
Received call from tele pt had (3) 10 beat run of Vtach. VSS, pt asymptomatic. MD Jimmye Norman notified, no new orders

## 2019-08-30 NOTE — Consult Note (Signed)
[address] [place], [place] [address] Phone [phone]. Fax [phone]  Date: 08/30/2019                  Patient Name:  Terry [name]  MRN: [id number]  DOB: 1975-12-28  Age / Sex: 44 y.o., male         PCP: System, Pcp Not In                 Service Requesting Consult: IM/ Wyvonnia Dusky, MD                 Reason for Consult: ARF            History of Present Illness: Patient is a 44 y.o. male with admitted to [facility] on 08/29/2019  Presented for nausea , dizziness and neck tightness Vomited 4-5 times No diarrhea, blood in stool or abdominal pain States he was working outside and may have had heat related illness Nephrology consult requested for acute kidney injury Serum creatinine has started to improve with IV hydration Patient is able to eat without nausea or vomiting No leg edema Able to void without any problems.  No hematuria No history of nonsteroidal use. No alcohol or drug use. Reports he has lost close to 200 pounds over the last few years.  Used to be 499 pounds  Lab Results  Component Value Date   CREATININE 3.73 (H) 08/30/2019   CREATININE 4.88 (H) 08/29/2019   CREATININE 5.18 (H) 08/29/2019     Medications: Outpatient medications: Medications Prior to Admission  Medication Sig Dispense Refill Last Dose   atorvastatin (LIPITOR) 40 MG tablet Take 40 mg by mouth daily.   08/28/2019 at Unknown time   hydrochlorothiazide (HYDRODIURIL) 25 MG tablet Take 25 mg by mouth daily.   08/28/2019 at Unknown time   lisinopril (ZESTRIL) 40 MG tablet Take 40 mg by mouth daily.    08/28/2019 at Unknown time    Current medications: Current Facility-Administered Medications  Medication Dose Route Frequency Provider Last Rate Last Admin   0.9 %  sodium chloride infusion   Intravenous Continuous Ivor Costa, MD 75 mL/hr at 08/30/19 1300 New Bag at 08/30/19 1300   acetaminophen (TYLENOL) tablet 650 mg  650 mg Oral Q6H PRN Ivor Costa, MD        aspirin EC tablet 81 mg  81 mg Oral Daily Ivor Costa, MD   81 mg at 08/30/19 6389   atorvastatin (LIPITOR) tablet 40 mg  40 mg Oral Daily Ivor Costa, MD   40 mg at 08/30/19 3734   heparin injection 5,000 Units  5,000 Units Subcutaneous Cleophas Dunker, MD   5,000 Units at 08/30/19 1313   insulin aspart (novoLOG) injection 0-5 Units  0-5 Units Subcutaneous QHS Ivor Costa, MD       insulin aspart (novoLOG) injection 0-9 Units  0-9 Units Subcutaneous TID WC Ivor Costa, MD   7 Units at 08/30/19 1208   insulin glargine (LANTUS) injection 25 Units  25 Units Subcutaneous Daily Ivor Costa, MD   25 Units at 08/30/19 1050   insulin starter kit- pen needles (English) 1 kit  1 kit Other Once Wyvonnia Dusky, MD       ondansetron [facility]) injection 4 mg  4 mg Intravenous Q8H PRN Ivor Costa, MD          Allergies: No Known Allergies    Past Medical History: Past Medical History:  Diagnosis Date   AICD (automatic cardioverter/defibrillator) present  CHF (congestive heart failure) (HCC)    Coronary artery disease    Diabetes mellitus without complication ([place])    Dysrhythmia    AFIB   Palpitations    Presence of permanent cardiac pacemaker    PACEMAKER / DEFIB     Past Surgical History: Past Surgical History:  Procedure Laterality Date   ABLATION     CARDIAC   CARDIAC DEFIBRILLATOR PLACEMENT     CATARACT EXTRACTION W/PHACO Right 02/15/2017   Procedure: CATARACT EXTRACTION PHACO AND INTRAOCULAR LENS PLACEMENT ([place]);  Surgeon: Birder Robson, MD;  Location: [facility] ORS;  Service: Ophthalmology;  Laterality: Right;  [place] 00:48 AP% 14.8 CDE 7.11 Fluid pack lot # [id number]   CATARACT EXTRACTION W/PHACO Left 03/15/2017   Procedure: CATARACT EXTRACTION PHACO AND INTRAOCULAR LENS PLACEMENT ([facility]);  Surgeon: Birder Robson, MD;  Location: [facility] ORS;  Service: Ophthalmology;  Laterality: Left;  [place] 00:46.0 AP% 11.2 CDE 5.15 FLUID PACK LOT # [id number]   CORONARY ANGIOPLASTY      INSERT / REPLACE / REMOVE PACEMAKER       Family History: Family History  Problem Relation Age of Onset   Ovarian cancer Mother    Diabetes Mellitus II Father    Heart disease Father    Hypertension Father      Social History: Social History   Socioeconomic History   Marital status: Divorced    Spouse name: Not on file   Number of children: Not on file   Years of education: Not on file   Highest education level: Not on file  Occupational History   Not on file  Tobacco Use   Smoking status: Never Smoker   Smokeless tobacco: Never Used  Vaping Use   Vaping Use: Never used  Substance and Sexual Activity   Alcohol use: No   Drug use: No   Sexual activity: Not on file  Other Topics Concern   Not on file  Social History Narrative   Not on file   Social Determinants of Health   Financial Resource Strain:    Difficulty of Paying Living Expenses:   Food Insecurity:    Worried About Charity fundraiser in the Last Year:    Arboriculturist in the Last Year:   Transportation Needs:    Film/video editor (Medical):    Lack of Transportation (Non-Medical):   Physical Activity:    Days of Exercise per Week:    Minutes of Exercise per Session:   Stress:    Feeling of Stress :   Social Connections:    Frequency of Communication with Friends and Family:    Frequency of Social Gatherings with Friends and Family:    Attends Religious Services:    Active Member of Clubs or Organizations:    Attends Music therapist:    Marital Status:   Intimate Partner Violence:    Fear of Current or Ex-Partner:    Emotionally Abused:    Physically Abused:    Sexually Abused:    Gen: Denies any fevers or chills HEENT: No vision or hearing problems CV: No chest pain or shortness of breath Resp: No cough or sputum production GI: today - No nausea, vomiting or diarrhea.  No blood in the stool GU : No problems with voiding.  No  hematuria.  No previous history of kidney problems MS: Ambulatory.  Denies any acute joint pain or swelling Derm:   No complaints Psych: No complaints Heme: No complaints Neuro: No complaints Endocrine:  No complaints   Vital Signs: Blood pressure (!) 151/92, pulse 74, temperature 97.6 F (36.4 C), resp. rate 17, height 5' 7"  (1.702 m), weight (!) 140.6 kg, SpO2 99 %.  No intake or output data in the 24 hours ending 08/30/19 1417  Weight trends: Filed Weights   08/29/19 0437  Weight: (!) 140.6 kg    Physical Exam: General:  No acute distress, laying in the bed  HEENT  anicteric, moist oral mucous membrane  Pulm/lungs  normal breathing effort, lungs are clear to auscultation  CVS/Heart  regular rhythm, no rub or gallop  Abdomen:   Soft, nontender  Extremities:  No peripheral edema  Neurologic:  Alert, oriented, able to follow commands  Skin:  No acute rashes      Lab results: Basic Metabolic Panel: Recent Labs  Lab 08/29/19 0452 08/29/19 0942 08/30/19 0519  NA 133* 131* 138  K 4.5 5.2* 4.2  CL 98 98 103  CO2 21* 22 25  GLUCOSE 428* 432* 285*  BUN 80* 80* 67*  CREATININE 5.18* 4.88* 3.73*  CALCIUM 8.5* 8.7* 8.2*    Liver Function Tests: Recent Labs  Lab 08/29/19 0452  AST 17  ALT 11  ALKPHOS 92  BILITOT 0.9  PROT 6.9  ALBUMIN 3.5   No results for input(s): LIPASE, AMYLASE in the last 168 hours. No results for input(s): AMMONIA in the last 168 hours.  CBC: Recent Labs  Lab 08/29/19 0452 08/30/19 0519  WBC 12.0* 8.3  NEUTROABS 8.1*  --   HGB 13.1 12.2*  HCT 39.6 37.4*  MCV 91.0 92.3  PLT 202 173    Cardiac Enzymes: Recent Labs  Lab 08/29/19 0452  CKTOTAL 131    BNP: Invalid input(s): POCBNP  CBG: Recent Labs  Lab 08/29/19 1106 08/29/19 1735 08/29/19 2137 08/30/19 0821 08/30/19 1131  GLUCAP 461* 181* 185* 227* 316*    Microbiology: Recent Results (from the past 720 hour(s))  Blood culture (routine x 2)     Status: None  (Preliminary result)   Collection Time: 08/29/19  6:21 AM   Specimen: BLOOD  Result Value Ref Range Status   Specimen Description BLOOD RAC  Final   Special Requests BOTTLES DRAWN AEROBIC AND ANAEROBIC BCAV  Final   Culture   Final    NO GROWTH 1 DAY Performed at [facility], [address] [place], [place] [address]    Report Status PENDING  Incomplete  Blood culture (routine x 2)     Status: None (Preliminary result)   Collection Time: 08/29/19  6:21 AM   Specimen: BLOOD  Result Value Ref Range Status   Specimen Description BLOOD LAC  Final   Special Requests BOTTLES DRAWN AEROBIC AND ANAEROBIC BCAV  Final   Culture   Final    NO GROWTH 1 DAY Performed at [facility], [address] [place], [place] [address]    Report Status PENDING  Incomplete  SARS Coronavirus 2 by RT PCR (hospital order, performed in [place] hospital lab) Nasopharyngeal Nasopharyngeal Swab     Status: None   Collection Time: 08/29/19  7:09 AM   Specimen: Nasopharyngeal Swab  Result Value Ref Range Status   SARS Coronavirus 2 NEGATIVE NEGATIVE Final    Comment: (NOTE) SARS-CoV-2 target nucleic acids are NOT DETECTED.  The SARS-CoV-2 RNA is generally detectable in upper and lower respiratory specimens during the acute phase of infection. The lowest concentration of SARS-CoV-2 viral copies this assay can detect is 250 copies /  mL. A negative result does not preclude SARS-CoV-2 infection and should not be used as the sole basis for treatment or other patient management decisions.  A negative result may occur with improper specimen collection / handling, submission of specimen other than nasopharyngeal swab, presence of viral mutation(s) within the areas targeted by this assay, and inadequate number of viral copies (<250 copies / mL). A negative result must be combined with clinical observations, patient history, and epidemiological information.  Fact Sheet for Patients:    StrictlyIdeas.no  Fact Sheet for Healthcare Providers: BankingDealers.co.za  This test is not yet approved or  cleared by the [place] FDA and has been authorized for detection and/or diagnosis of SARS-CoV-2 by FDA under an Emergency Use Authorization (EUA).  This EUA will remain in effect (meaning this test can be used) for the duration of the COVID-19 declaration under Section 564(b)(1) of the Act, 21 U.S.C. section 360bbb-3(b)(1), unless the authorization is terminated or revoked sooner.  Performed at [facility], [address] [place], [place] [address]      Coagulation Studies: No results for input(s): LABPROT, INR in the last 72 hours.  Urinalysis: Recent Labs    08/29/19 0900  COLORURINE YELLOW*  LABSPEC 1.013  PHURINE 5.0  GLUCOSEU >=500*  HGBUR NEGATIVE  BILIRUBINUR NEGATIVE  KETONESUR NEGATIVE  PROTEINUR NEGATIVE  NITRITE NEGATIVE  LEUKOCYTESUR NEGATIVE        Imaging: CT Head Wo Contrast  Result Date: 08/29/2019 CLINICAL DATA:  Nonspecific dizziness. EXAM: CT HEAD WITHOUT CONTRAST TECHNIQUE: Contiguous axial images were obtained from the base of the skull through the vertex without intravenous contrast. COMPARISON:  None. FINDINGS: Brain: No evidence of acute infarction, hemorrhage, hydrocephalus, extra-axial collection or mass lesion/mass effect. Vascular: No hyperdense vessel or unexpected calcification. Skull: Normal. Negative for fracture or focal lesion. Sinuses/Orbits: No acute finding. IMPRESSION: Negative head CT. Electronically Signed   By: Monte Fantasia M.D.   On: 08/29/2019 06:25   US RENAL  Result Date: 08/29/2019 CLINICAL DATA:  Acute kidney injury EXAM: RENAL / URINARY TRACT ULTRASOUND COMPLETE COMPARISON:  07/30/2008 FINDINGS: Right Kidney: Renal measurements: 11.3 by 5.9 x 5.9 cm. = volume: 207.72. mL. Diffuse cortical thinning noted. Echogenicity within normal limits. No mass  or hydronephrosis visualized. Left Kidney: Renal measurements: 10.4 by 5.9 x 5.1 cm. = volume: 162.68 mL. Diffuse cortical thinning noted. Echogenicity within normal limits. No mass or hydronephrosis visualized. Bladder: Appears normal for degree of bladder distention. Bilateral ureteral jets not visualized. Other: None. IMPRESSION: 1. No mass or hydronephrosis identified. 2. Bilateral renal cortical thinning. Electronically Signed   By: Kerby Moors M.D.   On: 08/29/2019 10:00      Assessment & Plan: Pt is a 44 y.o.   male with  Hypertension, hyperlipidemia, diabetes, AICD, atrial fibrillation, obstructive sleep apnea, CKD, diastolic CHF, CAD , was admitted on 08/29/2019 with Hyperglycemia [R73.9] AKI (acute kidney injury) ([place]) [N17.9] Acute kidney injury ([place]) [N17.9] Acute renal failure superimposed on stage 4 chronic kidney disease ([place]) [N17.9, N18.4] Acute kidney injury superimposed on chronic kidney disease ([place]) [N17.9, N18.9]    # AKI Admit Cr 5.18 AKI likely secondary to volume depletion and ATN caused by diuretic and lisinopril in the setting of volume depleted state.  Lab Results  Component Value Date   CREATININE 3.73 (H) 08/30/2019   CREATININE 4.88 (H) 08/29/2019   CREATININE 5.18 (H) 08/29/2019   Creatinine trend is improving with IV hydration Renal U/S:  Cortical thinning Plan: Continue current  management   # CKD st 3 b   Creatinine 2.14/ GFR 36-41 from 07/2019 Likely diabetic nephropathy We will set up outpatient follow-up  # DM-2 with CKD and proteinuria HbA1c 5.7% in 06/2019 Continue atorvastatin, lisinopril prior to discharge , for cardiovascular risk reduction    LOS: 0 Hillman Attig 7/29/20212:17 PM    Note: This note was prepared with Dragon dictation. Any transcription errors are unintentional

## 2019-08-30 NOTE — Progress Notes (Signed)
Inpatient Diabetes Program Recommendations  AACE/ADA: New Consensus Statement on Inpatient Glycemic Control   Target Ranges:  Prepandial:   less than 140 mg/dL      Peak postprandial:   less than 180 mg/dL (1-2 hours)      Critically ill patients:  140 - 180 mg/dL  Results for Terry [name], Terry [name] (MRN [id number]) as of 08/30/2019 13:43  Ref. Range 08/30/2019 08:21 08/30/2019 11:31  Glucose-Capillary Latest Ref Range: 70 - 99 mg/dL 227 (H) 316 (H)   Results for Terry [name], Terry [name] (MRN [id number]) as of 08/30/2019 10:33  Ref. Range 08/29/2019 08:44 08/29/2019 11:06 08/29/2019 17:35 08/29/2019 21:37  Glucose-Capillary Latest Ref Range: 70 - 99 mg/dL 399 (H) 461 (H) 181 (H) 185 (H)   Results for Terry [name], Terry [name] (MRN [id number]) as of 08/30/2019 10:33  Ref. Range 08/29/2019 04:52 08/29/2019 09:42 08/30/2019 05:19  Glucose Latest Ref Range: 70 - 99 mg/dL 428 (H) 432 (H) 285 (H)  Hemoglobin A1C Latest Ref Range: 4.8 - 5.6 %   11.5 (H)   Review of Glycemic Control  Diabetes history: DM2 Outpatient Diabetes medications:None listed on home medication list Current orders for Inpatient glycemic control: Lantus 25 units daily, Novolog 0-9 units TID with meals, Novolog 0-5 units QHS  Inpatient Diabetes Program Recommendations:    Insulin-Please consider increasing Lantus to 28 units daily and adding Novolog 5 units TID with meals for meal coverage if patient eats at least 50% of meals.  HbgA1C:  A1C 11.5% on 08/30/19. to evaluate glycemic control over the past 2-3 months. Patient is agreeable to taking insulin as an outpatient (would prefer to use insulin pens).  NOTE: Noted consult for diabetes coordinator. Chart reviewed. Noted in chart that patient seen PCP Dr. Jenell Milliner on 08/02/19 and per office note, patient's prior A1C was 8.7% in May 2021. At that time, patient was prescribed Ozempic 0.25 mg or 0.5 mg(2 mg/1.5 mL) PnIj injection; Inject 0.25 mg under the skin every seven (7) days for 28 days, THEN 0.5 mg every  seven (7) days for 28 days, THEN 1 mg every seven (7) days. Initial lab glucose 428 mg/dl on 08/29/19 and A1C 11.5% on 08/30/19.  Spoke with patient about diabetes and home regimen for diabetes control. Patient reports being followed by PCP for diabetes management and currently not taking any DM medications. Patient confirms that Ozempic was prescribed a few weeks ago and when he went to pharmacy to pick it up the cost was over $1000 so he was not able to get it. Patient has been working with PCP in getting paperwork completed (just completed this past Monday) to see if he can get approved for medication assistance for Ozempic.  Patient reports that he has not been checking glucose and would need Rx for glucose monitoring kit at discharge.  Patient states that he has been on insulin in the past (used Lantus and Novolog vial/syringe) but it was stopped after he lost 250 pounds and was able to get DM well controlled.  Patient is open to taking insulin again and feels like he needs to get something for his glucose since it is running so high.   Discussed A1C results (11.5% on 08/30/19) and explained that current A1C indicates an average glucose of 283 mg/dl over the past 2-3 months. Patient notes that his last A1C was 8.7% and prior to that it was in 7% range.   Discussed glucose and A1C goals. Discussed importance of checking CBGs and maintaining good CBG control to prevent  long-term and short-term complications. Explained how hyperglycemia leads to damage within blood vessels which lead to the common complications seen with uncontrolled diabetes. Stressed to the patient the importance of improving glycemic control to prevent further complications from uncontrolled diabetes. Discussed impact of nutrition, exercise, stress, sickness, and medications on diabetes control. Reviewed Lantus and Novolog as currently ordered and discussed potentially being discharged on insulin. Patient states that the attending doctor today  has told him that he would be discharged on insulin which he is agreeable to as long as it is affordable. Patient notes that he use to use vial/syringe. Discussed insulin pens to see which method patient would prefer and patient states that he would prefer to use insulin pens.  Educated patient on insulin pen use at home.  Reviewed all steps of insulin pen including attachment of needle, 2-unit air shot, dialing up dose, giving injection, removing needle, disposal of sharps, storage of unused insulin, disposal of insulin etc. Patient able to provide successful return demonstration. Will consult TOC to do benefit check to see which insulins are preferred with insurance and what patient's copay would be.  Encouraged patient to check glucose 3- 4 times per day (before meals and at bedtime) and to keep a log book of glucose readings and DM medication taken which patient will need to take to doctor appointments. Explained how the doctor can use the log book to continue to make adjustments with DM medications if needed. Reviewed hypoglycemia along with treatment. Informed patient that if he gets approved to use Ozempic and he is also on insulin, he would need to contact PCP if he experiences any hypoglycemia as he may need changes with DM medications.  Patient verbalized understanding of information discussed and reports no further questions at this time related to diabetes. At time of discharge, please provide Rx for: glucose monitoring kit (#[id number]), insulin pens (whichever is covered with insurance), and insulin pen needles [phone]).  Thanks, Barnie Alderman, RN, MSN, CDE Diabetes Coordinator Inpatient Diabetes Program [phone] (Team Pager from 8am to 5pm)

## 2019-08-31 LAB — GLUCOSE, CAPILLARY
Glucose-Capillary: 219 mg/dL — ABNORMAL HIGH (ref 70–99)
Glucose-Capillary: 212 mg/dL — ABNORMAL HIGH (ref 70–99)

## 2019-08-31 LAB — BASIC METABOLIC PANEL
Anion gap: 7 (ref 5–15)
BUN: 49 mg/dL — ABNORMAL HIGH (ref 6–20)
CO2: 24 mmol/L (ref 22–32)
Calcium: 8.3 mg/dL — ABNORMAL LOW (ref 8.9–10.3)
Chloride: 110 mmol/L (ref 98–111)
Creatinine, Ser: 2.76 mg/dL — ABNORMAL HIGH (ref 0.61–1.24)
GFR calc Af Amer: 31 mL/min — ABNORMAL LOW (ref >60)
GFR calc non Af Amer: 26 mL/min — ABNORMAL LOW (ref >60)
Glucose, Bld: 213 mg/dL — ABNORMAL HIGH (ref 70–99)
Potassium: 3.9 mmol/L (ref 3.5–5.1)
Sodium: 141 mmol/L (ref 135–145)

## 2019-08-31 LAB — CBC
HCT: 35.3 % — ABNORMAL LOW (ref 39.0–52.0)
Hemoglobin: 11.9 g/dL — ABNORMAL LOW (ref 13.0–17.0)
MCH: 30.4 pg (ref 26.0–34.0)
MCHC: 33.7 g/dL (ref 30.0–36.0)
MCV: 90.3 fL (ref 80.0–100.0)
Platelets: 164 10*3/uL (ref 150–400)
RBC: 3.91 MIL/uL — ABNORMAL LOW (ref 4.22–5.81)
RDW: 11.9 % (ref 11.5–15.5)
WBC: 8 10*3/uL (ref 4.0–10.5)
nRBC: 0 % (ref 0.0–0.2)

## 2019-08-31 LAB — PROTEIN / CREATININE RATIO, URINE
Creatinine, Urine: 125 mg/dL
Protein Creatinine Ratio: 0.14 mg/mg{Cre} (ref 0.00–0.15)
Total Protein, Urine: 17 mg/dL

## 2019-08-31 LAB — MAGNESIUM: Magnesium: 2.4 mg/dL (ref 1.7–2.4)

## 2019-08-31 MED ORDER — BLOOD GLUCOSE MONITOR KIT: PACK | 0 refills | Status: AC

## 2019-08-31 MED ORDER — INSULIN ASPART 100 UNIT/ML ~~LOC~~ SOLN: 5.0000 [IU] | Freq: Three times a day (TID) | SUBCUTANEOUS | 0 refills | Status: DC

## 2019-08-31 MED ORDER — "INSULIN SYRINGE-NEEDLE U-100 25G X 5/8"" 1 ML MISC": 0 refills | Status: AC

## 2019-08-31 MED ORDER — INSULIN NPH (HUMAN) (ISOPHANE) 100 UNIT/ML ~~LOC~~ SUSP
SUBCUTANEOUS | 3 refills | Status: DC
Start: 2019-08-31 — End: 2023-06-15

## 2019-08-31 MED ORDER — METOPROLOL SUCCINATE ER 25 MG PO TB24
25.0000 mg | ORAL_TABLET | Freq: Every day | ORAL | 0 refills | Status: DC
Start: 2019-08-31 — End: 2023-06-13

## 2019-08-31 NOTE — Discharge Summary (Addendum)
Physician Discharge Summary  JORDANI NUNN MBW:[id number] DOB: Feb 08, 1975 DOA: 08/29/2019  PCP: System, Pcp Not In  Admit date: 08/29/2019 Discharge date: 08/31/2019  Admitted From: home Disposition: home  Recommendations for Outpatient Follow-up:  1. Follow up with PCP in 1 weeks 2. F/u endo in 1 week for uncontrolled DM2 3. F/u cardio in 1 week for arrhythmia  4. F/u nephro in 2 weeks for CKD   Home Health: no  Equipment/Devices:  Discharge Condition: stable  CODE STATUS: full  Diet recommendation: Heart Healthy / Carb Modified   Brief/Interim Summary: HPI was taken from Dr. Blaine Hamper: York Ram AVIAN KONIGSBERG is a 44 y.o. male with medical history significant of hypertension, hyperlipidemia, diabetes mellitus, AICD, atrial fibrillation not on anticoagulants, OSA, CKD-4, dCHF, CAD, who presents with nausea, vomiting and dizziness.  Patient states that he had worked outside yesterday.  He developed nausea and vomiting.  He has had nonbilious nonbloody vomiting 4-5 times.  Patient denies diarrhea and abdominal pain.  No fever or chills.  Patient states that his nausea and vomiting have resolved.  Currently no nausea vomiting, diarrhea or abdominal pain.  Patient feels dizziness and lightheadedness.  Patient denies chest pain, shortness breath, cough.  No symptoms of UTI.  Patient states that he has history of diabetes, and is about to start diabetic medications this week, but has not yet.  Patient had hypotension initially with blood pressure 83/50, which improved to 110/71 after giving 2 L normal saline bolus in the ED.  ED Course: pt was found to have worsening renal function with creatinine 2.14 on 07/25/2019 up to 5.18, BUN 80, negative urinalysis, WBC 12.0, lactic acid 1.5, 1.3, troponin 42, 38, CK 131, BNP 60.5, negative COVID-19 PCR, VBG with pH of 7.29, CO2 53, O2<31. Blood sugar 428, anion gap 14, temperature normal, heart rate 85, RR 14, oxygen saturation 96% on room air.  CT head  negative.  Patient is placed on MedSurg bed for observation.  Hospital Course Dr. Lenise Herald 7/28-7/30/21: Pt was found to have AKI on CKDIIIb likely secondary to ATN due to HCTZ, ACE-I & poorly controlled DM2. Pt's Cr was trending down on day of d/c and is at 2.76 on day of d/c. Furthermore, pt has very poorly controlled DM2 and was started back on insulin. DM coordinator did provide education to the pt for insulin use as well as checking his BS at home. Pt was taken off of insulin as an outpatient by his PCP as per pt. It was recommended that see an endocrinologist as an outpatient as pt has very poorly controlled DM2. Pt verbalized his understanding. Also, pt has hx of chronic a.fib not on beta blocker or anticoagulation. Pt was started on low dose beta blocker while inpatient. Pt should f/u cardiologist as an outpatient.   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 Baseline Cr is 2.14. Likely secondary to ATN from lisinopril and HCTZ use & uncontrolled DM2. Cr is trending down from day prior. Will continue to monitor   CAD: w/ elevated troponins. Likely secondary to demand ischemia & AKI  on CKD.  Continue aspirin, statin.   Chronic diastolic CHF: w/o exacerbation. Hold home ACE-I. Not on lasix or beta blocker at home   Chronic a. fib: rate controlled currently. CHA2DS2-VASc Score is 4, needs oral anticoagulation, but unsure why pt is not on anticoagulation. Need to follow-up with PCP &  cardiology about possibly starting anticoagulant  DM2: Hba1c 11.5, poorly controled. Continue on  lantus & SSI w/ accuchecks. DM coordinator consulted. Will need to see endocrinology as an outpatient  Hypotension: resolved  Nausea & vomiting: resolved. Zofran prn   Leukocytosis: likely reactive. Will continue to monitor   Morbidly obese: BMI 48.5. Complicates above stated care. Would benefit from significant weight loss  Discharge Instructions  Discharge Instructions    Diet - low sodium heart healthy   Complete by: As directed    Diet Carb Modified   Complete by: As directed    Discharge instructions   Complete by: As directed    F/u PCP in 1 week to get referral to go see an endocrinologist for uncontrolled DM2   Increase activity slowly   Complete by: As directed      Allergies as of 08/31/2019   No Known Allergies     Medication List    TAKE these medications   atorvastatin 40 MG tablet Commonly known as: LIPITOR Take 40 mg by mouth daily.   blood glucose meter kit and supplies Kit Dispense based on patient and insurance preference. Use up to four times daily as directed. (FOR ICD-9 250.00, 250.01).   hydrochlorothiazide 25 MG tablet Commonly known as: HYDRODIURIL Take 25 mg by mouth daily.   insulin aspart 100 UNIT/ML injection Commonly known as: novoLOG Inject 5 Units into the skin 3 (three) times daily with meals.   insulin NPH Human 100 UNIT/ML injection Commonly known as: NovoLIN N 20 units daily   Insulin Syringe-Needle U-100 25G X 5/8" 1 ML Misc Needs enough insulin needles & syringes for insulin aspart 5 units TID w/ meals & insulin NPH 20 units daily for 30 days   lisinopril 40 MG tablet Commonly known as: ZESTRIL Take 40 mg by mouth daily.   metoprolol succinate 25 MG 24 hr tablet Commonly known as: Toprol XL Take 1 tablet (25 mg total) by mouth daily.       No Known Allergies  Consultations:  nephro    Procedures/Studies: CT Head Wo Contrast  Result Date: 08/29/2019 CLINICAL DATA:  Nonspecific dizziness. EXAM: CT HEAD  WITHOUT CONTRAST TECHNIQUE: Contiguous axial images were obtained from the base of the skull through the vertex without intravenous contrast. COMPARISON:  None. FINDINGS: Brain: No evidence of acute infarction, hemorrhage, hydrocephalus, extra-axial collection or mass lesion/mass effect. Vascular: No hyperdense vessel or unexpected calcification. Skull: Normal. Negative for fracture or focal lesion. Sinuses/Orbits: No acute finding. IMPRESSION: Negative head CT. Electronically Signed   By: Monte Fantasia M.D.   On: 08/29/2019 06:25   US RENAL  Result Date: 08/29/2019 CLINICAL DATA:  Acute kidney injury EXAM: RENAL / URINARY TRACT ULTRASOUND COMPLETE COMPARISON:  07/30/2008 FINDINGS: Right Kidney: Renal measurements: 11.3 by 5.9 x 5.9 cm. = volume: 207.72. mL. Diffuse cortical thinning noted. Echogenicity within normal limits. No mass or hydronephrosis visualized. Left Kidney: Renal measurements: 10.4 by 5.9 x 5.1 cm. = volume: 162.68 mL. Diffuse cortical thinning noted. Echogenicity within normal limits. No mass or hydronephrosis visualized. Bladder: Appears normal for degree of bladder distention. Bilateral ureteral jets not visualized. Other: None. IMPRESSION: 1. No mass or hydronephrosis  identified. 2. Bilateral renal cortical thinning. Electronically Signed   By: Kerby Moors M.D.   On: 08/29/2019 10:00       Subjective: Pt c/o malaise    Discharge Exam: Vitals:   08/31/19 0008 08/31/19 0738  BP: (!) 143/90 (!) 158/94  Pulse: 72 73  Resp: 18 15  Temp: 98 F (36.7 C) 98 F (36.7 C)  SpO2: 97% 98%   Vitals:   08/30/19 1544 08/30/19 1806 08/31/19 0008 08/31/19 0738  BP: (!) 151/91 (!) 138/86 (!) 143/90 (!) 158/94  Pulse: 72  72 73  Resp: _0 Temp: 98.1 F (36.7 C) 98.1 F (36.7 C) 98 F (36.7 C) 98 F (36.7 C)  TempSrc: Oral Oral Oral Oral  SpO2: 100% 100% 97% 98%  Weight:      Height:        General: Pt is alert, awake, not in acute distress Cardiovascular:  S1/S2 +, no rubs, no gallops Respiratory: CTA bilaterally, no wheezing, no rhonchi Abdominal: Soft, NT, obese, bowel sounds + Extremities: no cyanosis    The results of significant diagnostics from this hospitalization (including imaging, microbiology, ancillary and laboratory) are listed below for reference.     Microbiology: Recent Results (from the past 240 hour(s))  Blood culture (routine x 2)     Status: None (Preliminary result)   Collection Time: 08/29/19  6:21 AM   Specimen: BLOOD  Result Value Ref Range Status   Specimen Description BLOOD RAC  Final   Special Requests BOTTLES DRAWN AEROBIC AND ANAEROBIC BCAV  Final   Culture   Final    NO GROWTH 2 DAYS Performed at [facility], [address] [place], [place] [address]    Report Status PENDING  Incomplete  Blood culture (routine x 2)     Status: None (Preliminary result)   Collection Time: 08/29/19  6:21 AM   Specimen: BLOOD  Result Value Ref Range Status   Specimen Description BLOOD LAC  Final   Special Requests BOTTLES DRAWN AEROBIC AND ANAEROBIC BCAV  Final   Culture   Final    NO GROWTH 2 DAYS Performed at [facility], [address] [place],  [address]    Report Status PENDING  Incomplete  SARS Coronavirus 2 by RT PCR (hospital order, performed in [facility] hospital lab) Nasopharyngeal Nasopharyngeal Swab     Status: None   Collection Time: 08/29/19  7:09 AM   Specimen: Nasopharyngeal Swab  Result Value Ref Range Status   SARS Coronavirus 2 NEGATIVE NEGATIVE Final    Comment: (NOTE) SARS-CoV-2 target nucleic acids are NOT DETECTED.  The SARS-CoV-2 RNA is generally detectable in upper and lower respiratory specimens during the acute phase of infection. The lowest concentration of SARS-CoV-2 viral copies this assay can detect is 250 copies / mL. A negative result does not preclude SARS-CoV-2 infection and should not be used as the sole basis for treatment or other patient  management decisions.  A negative result may occur with improper specimen collection / handling, submission of specimen other than nasopharyngeal swab, presence of viral mutation(s) within the areas targeted by this assay, and inadequate number of viral copies (<250 copies / mL). A negative result must be combined with clinical observations, patient history, and epidemiological information.  Fact Sheet for Patients:   StrictlyIdeas.no  Fact Sheet for Healthcare Providers: BankingDealers.co.za  This test is not yet approved or  cleared by the [place] FDA and has been authorized for  detection and/or diagnosis of SARS-CoV-2 by FDA under an Emergency Use Authorization (EUA).  This EUA will remain in effect (meaning this test can be used) for the duration of the COVID-19 declaration under Section 564(b)(1) of the Act, 21 U.S.C. section 360bbb-3(b)(1), unless the authorization is terminated or revoked sooner.  Performed at [facility], [place] [place], [place] [address]      Labs: BNP (last 3 results) Recent Labs    08/29/19 0452  BNP 94.0   Basic Metabolic Panel: Recent Labs  Lab 08/29/19 0452 08/29/19 0942 08/30/19 0519 08/31/19 0421  NA 133* 131* 138 141  K 4.5 5.2* 4.2 3.9  CL 98 98 103 110  CO2 21* _0 GLUCOSE 428* 432* 285* 213*  BUN 80* 80* 67* 49*  CREATININE 5.18* 4.88* 3.73* 2.76*  CALCIUM 8.5* 8.7* 8.2* 8.3*  MG  --   --   --  2.4   Liver Function Tests: Recent Labs  Lab 08/29/19 0452  AST 17  ALT 11  ALKPHOS 92  BILITOT 0.9  PROT 6.9  ALBUMIN 3.5   No results for input(s): LIPASE, AMYLASE in the last 168 hours. No results for input(s): AMMONIA in the last 168 hours. CBC: Recent Labs  Lab 08/29/19 0452 08/30/19 0519 08/31/19 0421  WBC 12.0* 8.3 8.0  NEUTROABS 8.1*  --   --   HGB 13.1 12.2* 11.9*  HCT 39.6 37.4* 35.3*  MCV 91.0 92.3 90.3  PLT 202 173 164    Cardiac Enzymes: Recent Labs  Lab 08/29/19 0452  CKTOTAL 131   BNP: Invalid input(s): POCBNP CBG: Recent Labs  Lab 08/30/19 0821 08/30/19 1131 08/30/19 1624 08/30/19 2100 08/31/19 1138  GLUCAP 227* 316* 184* 153* 219*   D-Dimer No results for input(s): DDIMER in the last 72 hours. Hgb A1c Recent Labs    08/30/19 0519  HGBA1C 11.5*   Lipid Profile Recent Labs    08/30/19 0519  CHOL 124  HDL 27*  LDLCALC 70  TRIG 134  CHOLHDL 4.6   Thyroid function studies No results for input(s): TSH, T4TOTAL, T3FREE, THYROIDAB in the last 72 hours.  Invalid input(s): FREET3 Anemia work up No results for input(s): VITAMINB12, FOLATE, FERRITIN, TIBC, IRON, RETICCTPCT in the last 72 hours. Urinalysis    Component Value Date/Time   COLORURINE YELLOW (A) 08/29/2019 0900   APPEARANCEUR CLEAR (A) 08/29/2019 0900   LABSPEC 1.013 08/29/2019 0900   PHURINE 5.0 08/29/2019 0900   GLUCOSEU >=500 (A) 08/29/2019 0900   HGBUR NEGATIVE 08/29/2019 0900   BILIRUBINUR NEGATIVE 08/29/2019 0900   KETONESUR NEGATIVE 08/29/2019 0900   PROTEINUR NEGATIVE 08/29/2019 0900   NITRITE NEGATIVE 08/29/2019 0900   LEUKOCYTESUR NEGATIVE 08/29/2019 0900   Sepsis Labs Invalid input(s): PROCALCITONIN,  WBC,  LACTICIDVEN Microbiology Recent Results (from the past 240 hour(s))  Blood culture (routine x 2)     Status: None (Preliminary result)   Collection Time: 08/29/19  6:21 AM   Specimen: BLOOD  Result Value Ref Range Status   Specimen Description BLOOD RAC  Final   Special Requests BOTTLES DRAWN AEROBIC AND ANAEROBIC BCAV  Final   Culture   Final    NO GROWTH 2 DAYS Performed at [facility], [address] [place], [place] [address]    Report Status PENDING  Incomplete  Blood culture (routine x 2)     Status: None (Preliminary result)   Collection Time: 08/29/19  6:21 AM   Specimen: BLOOD  Result Value  Ref Range Status   Specimen Description BLOOD LAC  Final   Special Requests  BOTTLES DRAWN AEROBIC AND ANAEROBIC BCAV  Final   Culture   Final    NO GROWTH 2 DAYS Performed at [facility], [address] [place], [place] [address]    Report Status PENDING  Incomplete  SARS Coronavirus 2 by RT PCR (hospital order, performed in [facility] hospital lab) Nasopharyngeal Nasopharyngeal Swab     Status: None   Collection Time: 08/29/19  7:09 AM   Specimen: Nasopharyngeal Swab  Result Value Ref Range Status   SARS Coronavirus 2 NEGATIVE NEGATIVE Final    Comment: (NOTE) SARS-CoV-2 target nucleic acids are NOT DETECTED.  The SARS-CoV-2 RNA is generally detectable in upper and lower respiratory specimens during the acute phase of infection. The lowest concentration of SARS-CoV-2 viral copies this assay can detect is 250 copies / mL. A negative result does not preclude SARS-CoV-2 infection and should not be used as the sole basis for treatment or other patient management decisions.  A negative result may occur with improper specimen collection / handling, submission of specimen other than nasopharyngeal swab, presence of viral mutation(s) within the areas targeted by this assay, and inadequate number of viral copies (<250 copies / mL). A negative result must be combined with clinical observations, patient history, and epidemiological information.  Fact Sheet for Patients:   StrictlyIdeas.no  Fact Sheet for Healthcare Providers: BankingDealers.co.za  This test is not yet approved or  cleared by the [place] FDA and has been authorized for detection and/or diagnosis of SARS-CoV-2 by FDA under an Emergency Use Authorization (EUA).  This EUA will remain in effect (meaning this test can be used) for the duration of the COVID-19 declaration under Section 564(b)(1) of the Act, 21 U.S.C. section 360bbb-3(b)(1), unless the authorization is terminated or revoked sooner.  Performed at [facility],  [address], [place], [place] [address]      Time coordinating discharge: Over 30 minutes  SIGNED:   Wyvonnia Dusky, MD  [facility] 08/31/2019, 3:01 PM Pager   If 7PM-7AM, please contact night-coverage [email or web address]

## 2019-08-31 NOTE — Consult Note (Signed)
[address] [place], [place] [address] Phone [phone]. Fax [phone]  Date: 08/31/2019                  Patient Name:  Terry [name]  MRN: [id number]  DOB: 08-13-1975  Age / Sex: 44 y.o., male         PCP: System, Pcp Not In                 Service Requesting Consult: IM/ Wyvonnia Dusky, MD                 Reason for Consult: ARF            History of Present Illness: Patient is a 44 y.o. male with admitted to [facility] on 08/29/2019  Presented for nausea , dizziness and neck tightness Vomited 4-5 times No diarrhea, blood in stool or abdominal pain States he was working outside and may have had heat related illness Nephrology consult requested for acute kidney injury  Serum creatinine has started to improve with IV hydration Patient is able to eat without nausea or vomiting No leg edema Able to void without any problems.  No hematuria No history of nonsteroidal use. No alcohol or drug use. Reports he has lost close to 200 pounds over the last few years.  Used to be 499 pounds  Lab Results  Component Value Date   CREATININE 2.76 (H) 08/31/2019   CREATININE 3.73 (H) 08/30/2019   CREATININE 4.88 (H) 08/29/2019     Current medications: Current Facility-Administered Medications  Medication Dose Route Frequency Provider Last Rate Last Admin   0.9 %  sodium chloride infusion   Intravenous Continuous Ivor Costa, MD 75 mL/hr at 08/30/19 2349 New Bag at 08/30/19 2349   acetaminophen (TYLENOL) tablet 650 mg  650 mg Oral Q6H PRN Ivor Costa, MD       aspirin EC tablet 81 mg  81 mg Oral Daily Ivor Costa, MD   81 mg at 08/31/19 0958   atorvastatin (LIPITOR) tablet 40 mg  40 mg Oral Daily Ivor Costa, MD   40 mg at 08/31/19 0958   heparin injection 5,000 Units  5,000 Units Subcutaneous Cleophas Dunker, MD   5,000 Units at 08/31/19 1319   insulin aspart (novoLOG) injection 0-5 Units  0-5 Units Subcutaneous QHS Ivor Costa, MD       insulin aspart  (novoLOG) injection 0-9 Units  0-9 Units Subcutaneous TID WC Ivor Costa, MD   3 Units at 08/31/19 0959   insulin glargine (LANTUS) injection 25 Units  25 Units Subcutaneous Daily Ivor Costa, MD   25 Units at 08/31/19 1001   ondansetron (ZOFRAN) injection 4 mg  4 mg Intravenous Q8H PRN Ivor Costa, MD         Vital Signs: Blood pressure (!) 158/94, pulse 73, temperature 98 F (36.7 C), temperature source Oral, resp. rate 15, height 5\' 7"  (1.702 m), weight (!) 140.6 kg, SpO2 98 %.   Intake/Output Summary (Last 24 hours) at 08/31/2019 1319 Last data filed at 08/31/2019 0954 Gross per 24 hour  Intake 924.8 ml  Output --  Net 924.8 ml    Weight trends: [employer]   08/29/19 0437  Weight: (!) 140.6 kg    Physical Exam: General:  No acute distress, laying in the bed  HEENT  anicteric, moist oral mucous membrane  Pulm/lungs  normal breathing effort, lungs are clear to auscultation  CVS/Heart  regular rhythm, no rub or gallop  Abdomen:   Soft, nontender  Extremities:  No peripheral edema  Neurologic:  Alert, oriented, able to follow commands  Skin:  No acute rashes      Lab results: Basic Metabolic Panel: Recent Labs  Lab 08/29/19 0942 08/30/19 0519 08/31/19 0421  NA 131* 138 141  K 5.2* 4.2 3.9  CL 98 103 110  CO2 22 25 24   GLUCOSE 432* 285* 213*  BUN 80* 67* 49*  CREATININE 4.88* 3.73* 2.76*  CALCIUM 8.7* 8.2* 8.3*  MG  --   --  2.4    Liver Function Tests: Recent Labs  Lab 08/29/19 0452  AST 17  ALT 11  ALKPHOS 92  BILITOT 0.9  PROT 6.9  ALBUMIN 3.5   No results for input(s): LIPASE, AMYLASE in the last 168 hours. No results for input(s): AMMONIA in the last 168 hours.  CBC: Recent Labs  Lab 08/29/19 0452 08/29/19 0452 08/30/19 0519 08/31/19 0421  WBC 12.0*   < > 8.3 8.0  NEUTROABS 8.1*  --   --   --   HGB 13.1   < > 12.2* 11.9*  HCT 39.6   < > 37.4* 35.3*  MCV 91.0   < > 92.3 90.3  PLT 202   < > 173 164   < > = values in this interval not  displayed.    Cardiac Enzymes: Recent Labs  Lab 08/29/19 0452  CKTOTAL 131    BNP: Invalid input(s): POCBNP  CBG: Recent Labs  Lab 08/30/19 0821 08/30/19 1131 08/30/19 1624 08/30/19 2100 08/31/19 1138  GLUCAP 227* 316* 184* 153* 219*    Microbiology: Recent Results (from the past 720 hour(s))  Blood culture (routine x 2)     Status: None (Preliminary result)   Collection Time: 08/29/19  6:21 AM   Specimen: BLOOD  Result Value Ref Range Status   Specimen Description BLOOD RAC  Final   Special Requests BOTTLES DRAWN AEROBIC AND ANAEROBIC BCAV  Final   Culture   Final    NO GROWTH 2 DAYS Performed at [facility], [address] [place], [place] [address]    Report Status PENDING  Incomplete  Blood culture (routine x 2)     Status: None (Preliminary result)   Collection Time: 08/29/19  6:21 AM   Specimen: BLOOD  Result Value Ref Range Status   Specimen Description BLOOD LAC  Final   Special Requests BOTTLES DRAWN AEROBIC AND ANAEROBIC BCAV  Final   Culture   Final    NO GROWTH 2 DAYS Performed at [facility], [address] [place], [place] [address]    Report Status PENDING  Incomplete  SARS Coronavirus 2 by RT PCR (hospital order, performed in [place] hospital lab) Nasopharyngeal Nasopharyngeal Swab     Status: None   Collection Time: 08/29/19  7:09 AM   Specimen: Nasopharyngeal Swab  Result Value Ref Range Status   SARS Coronavirus 2 NEGATIVE NEGATIVE Final    Comment: (NOTE) SARS-CoV-2 target nucleic acids are NOT DETECTED.  The SARS-CoV-2 RNA is generally detectable in upper and lower respiratory specimens during the acute phase of infection. The lowest concentration of SARS-CoV-2 viral copies this assay can detect is 250 copies / mL. A negative result does not preclude SARS-CoV-2 infection and should not be used as the sole basis for treatment or other patient management decisions.  A negative result may occur with improper  specimen collection / handling, submission of specimen other than nasopharyngeal swab, presence of  viral mutation(s) within the areas targeted by this assay, and inadequate number of viral copies (<250 copies / mL). A negative result must be combined with clinical observations, patient history, and epidemiological information.  Fact Sheet for Patients:   StrictlyIdeas.no  Fact Sheet for Healthcare Providers: BankingDealers.co.za  This test is not yet approved or  cleared by the [place] FDA and has been authorized for detection and/or diagnosis of SARS-CoV-2 by FDA under an Emergency Use Authorization (EUA).  This EUA will remain in effect (meaning this test can be used) for the duration of the COVID-19 declaration under Section 564(b)(1) of the Act, 21 U.S.C. section 360bbb-3(b)(1), unless the authorization is terminated or revoked sooner.  Performed at [facility], [address] [place], [place] [address]      Coagulation Studies: No results for input(s): LABPROT, INR in the last 72 hours.  Urinalysis: Recent Labs    08/29/19 0900  COLORURINE YELLOW*  LABSPEC 1.013  PHURINE 5.0  GLUCOSEU >=500*  HGBUR NEGATIVE  BILIRUBINUR NEGATIVE  KETONESUR NEGATIVE  PROTEINUR NEGATIVE  NITRITE NEGATIVE  LEUKOCYTESUR NEGATIVE        Imaging: No results found.   Assessment & Plan: Pt is a 44 y.o.   male with  Hypertension, hyperlipidemia, diabetes, AICD, atrial fibrillation, obstructive sleep apnea, CKD, diastolic CHF, CAD , was admitted on 08/29/2019 with Hyperglycemia [R73.9] AKI (acute kidney injury) ([place]) [N17.9] Acute kidney injury ([place]) [N17.9] Acute renal failure superimposed on stage 4 chronic kidney disease ([place]) [N17.9, N18.4] Acute kidney injury superimposed on chronic kidney disease ([place]) [N17.9, N18.9]    # AKI Admit Cr 5.18 AKI likely secondary to volume depletion and ATN caused by diuretic  and lisinopril in the setting of volume depleted state.  Lab Results  Component Value Date   CREATININE 2.76 (H) 08/31/2019   CREATININE 3.73 (H) 08/30/2019   CREATININE 4.88 (H) 08/29/2019   Creatinine trend is improving with IV hydration Renal U/S:  Cortical thinning Plan: Continue current management   # CKD st 3 b   Creatinine 2.14/ GFR 36-41 from 07/2019 Likely diabetic nephropathy We will set up outpatient follow-up  # DM-2 with CKD and proteinuria HbA1c 5.7% in 06/2019 Continue atorvastatin, lisinopril prior to discharge , for cardiovascular risk reduction    LOS: 1 Raeanne Deschler 7/30/20211:19 PM    Note: This note was prepared with Dragon dictation. Any transcription errors are unintentional

## 2019-08-31 NOTE — Plan of Care (Signed)
  Problem: Education: Goal: Knowledge of General Education information will improve Description: Including pain rating scale, medication(s)/side effects and non-pharmacologic comfort measures Outcome: Progressing   Problem: Safety: Goal: Ability to remain free from injury will improve Outcome: Progressing   Problem: Skin Integrity: Goal: Risk for impaired skin integrity will decrease Outcome: Progressing   

## 2019-08-31 NOTE — Progress Notes (Addendum)
Inpatient Diabetes Program Recommendations  AACE/ADA: New Consensus Statement on Inpatient Glycemic Control (2015)  Target Ranges:  Prepandial:   less than 140 mg/dL      Peak postprandial:   less than 180 mg/dL (1-2 hours)      Critically ill patients:  140 - 180 mg/dL   Results for [name], Terry (MRN [id number]) as of 08/31/2019 06:38  Ref. Range 08/30/2019 08:21 08/30/2019 11:31 08/30/2019 16:24 08/30/2019 21:00  Glucose-Capillary Latest Ref Range: 70 - 99 mg/dL 227 (H)  3 units NOVOLOG  316 (H)  7 units NOVOLOG +  25 units LANTUS  184 (H)  2 units NOVOLOG  153 (H)   Results for Terry, MATHERNE (MRN [id number]) as of 08/31/2019 11:46  Ref. Range 08/31/2019 04:21  Glucose Latest Ref Range: 70 - 99 mg/dL 213 (H)   Home DM Meds: None listed on home medication list  Current Orders: Lantus 25 units Daily      Novolog Sensitive Correction Scale/ SSI (0-9 units) TID AC + HS    MD- Note that per conversation with the diabetes coordinator yesterday, pt willing to take insulin again at home as long as it is affordable.  TOC consult placed for benefits check to see which insulins are most affordable.  AM glucose elevated to 213 this AM.  Please consider increasing Lantus to 28 units Daily  If benefit check determines Lantus and Novolog are too expensive, may consider switching pt to 70/30 Insulin at time of d/c home.  Pt can purchase 70/30 Insulin at [facility] for $25 per vial or $43 per 5 pack of insulin pens. Would start with 70/30 Insulin 20 units BID with meals   Noted in chart that patient saw PCP Dr. Jenell Milliner on 08/02/19 and per office note, patient's prior A1C was 8.7% in May 2021. At that time, patient was prescribed Ozempic 0.25 mg or 0.5 mg(2 mg/1.5 mL) PnIj injection; Inject 0.25 mg under the skin every seven (7) days for 28 days, THEN 0.5 mg every seven (7) days for 28 days, THEN 1 mg every seven (7) days  Patient confirms that Ozempic was prescribed a few weeks ago and when  he went to pharmacy to pick it up the cost was over $1000 so he was not able to get it. Patient has been working with PCP in getting paperwork completed (just completed this past Monday) to see if he can get approved for medication assistance for Ozempic.  Patient reports that he has not been checking glucose and would need Rx for glucose monitoring kit at discharge.  Patient states that he has been on insulin in the past (used Lantus and Novolog vial/syringe) but it was stopped after he lost 250 pounds and was able to get DM well controlled.  Patient is open to taking insulin again and feels like he needs to get something for his glucose since it is running so high.    --Will follow patient during hospitalization--  Wyn Quaker RN, MSN, CDE Diabetes Coordinator Inpatient Glycemic Control Team Team Pager: [phone] (8a-5p)

## 2019-08-31 NOTE — TOC Progression Note (Addendum)
Transition of Care [facility]) - Progression Note    Patient Details  Name: Terry [name] MRN: [id number] Date of Birth: February 13, 1975  Transition of Care [facility]) CM/SW [place], RN Phone Number: 08/31/2019, 1:20 PM  Clinical Narrative:    Dalbert Garnet to inquire about relion [employer] brand insulin pens,  391.44$ basaglar long acting pens relion short acting with good rx $347.53 $24.88 is regular price for R, N and 70/30 The pen is $42.88 5 pens 1500 units per box, notified the physician, Spoke with the patient he has a good RX card, I provided him with a website and a phone number to an insulin financial assistance program that he can apply for, He stated that he wanted to get the vial of insulin to save money instead of the Pen, I notified the physician       Expected Discharge Plan and Services                                                 Social Determinants of Health (SDOH) Interventions    Readmission Risk Interventions No flowsheet data found.

## 2019-08-31 NOTE — Plan of Care (Signed)
  Problem: Education: Goal: Knowledge of General Education information will improve Description: Including pain rating scale, medication(s)/side effects and non-pharmacologic comfort measures 08/31/2019 1035 by Trula Slade, RN Outcome: Progressing 08/31/2019 1032 by Trula Slade, RN Outcome: Progressing 08/31/2019 1032 by Trula Slade, RN Outcome: Progressing   Problem: Safety: Goal: Ability to remain free from injury will improve 08/31/2019 1035 by Trula Slade, RN Outcome: Progressing 08/31/2019 1032 by Trula Slade, RN Outcome: Progressing 08/31/2019 1032 by Trula Slade, RN Outcome: Progressing   Problem: Skin Integrity: Goal: Risk for impaired skin integrity will decrease 08/31/2019 1035 by Trula Slade, RN Outcome: Progressing 08/31/2019 1032 by Trula Slade, RN Outcome: Progressing 08/31/2019 1032 by Trula Slade, RN Outcome: Progressing   Problem: Education: Goal: Ability to describe self-care measures that may prevent or decrease complications (Diabetes Survival Skills Education) will improve Outcome: Progressing Goal: Individualized Educational Video(s) Outcome: Progressing   Problem: Coping: Goal: Ability to adjust to condition or change in health will improve Outcome: Progressing   Problem: Fluid Volume: Goal: Ability to maintain a balanced intake and output will improve Outcome: Progressing   Problem: Health Behavior/Discharge Planning: Goal: Ability to identify and utilize available resources and services will improve Outcome: Progressing Goal: Ability to manage health-related needs will improve Outcome: Progressing   Problem: Metabolic: Goal: Ability to maintain appropriate glucose levels will improve Outcome: Progressing   Problem: Nutritional: Goal: Maintenance of adequate nutrition will improve Outcome: Progressing Goal: Progress toward achieving an optimal weight will improve Outcome: Progressing    Problem: Skin Integrity: Goal: Risk for impaired skin integrity will decrease Outcome: Progressing   Problem: Tissue Perfusion: Goal: Adequacy of tissue perfusion will improve Outcome: Progressing  Reviewed carb modifications and foods high in carbs to moderate with pt - also reviewed s/s of hypo hyperglycemia and to check bg prior to eating for accurate readings.  Understanding voiced of all instructions

## 2019-08-31 NOTE — TOC Benefit Eligibility Note (Signed)
Transition of Care [facility]) Benefit Eligibility Note    Patient Details  Name: Terry [name] MRN: [id number] Date of Birth: 03/12/1975   Medication/Dose: Lantus or Novolog Pens                       Additional Notes: Pt has Medicare Part A & B, no drug coverage policy [id number]    Darius Bump [place] Phone Number: 08/31/2019, 1:09 PM

## 2019-08-31 NOTE — Progress Notes (Signed)
Central tele called. Pt had 18 beats of VT. Pt asymptomatic at this time. Sharion Settler made aware.

## 2019-09-01 LAB — GLUCOSE, CAPILLARY: Glucose-Capillary: 178 mg/dL — ABNORMAL HIGH (ref 70–99)

## 2019-09-03 LAB — CULTURE, BLOOD (ROUTINE X 2)
Culture: NO GROWTH
Culture: NO GROWTH

## 2021-09-04 IMAGING — CT CT HEAD W/O CM
3 series · 16 of 47 positions shown, 19 images · non-contrast
Comparison: None.

CLINICAL DATA: Nonspecific dizziness.

EXAM:
CT HEAD WITHOUT CONTRAST
TECHNIQUE: Contiguous axial images were obtained from the base of the skull
through the vertex without intravenous contrast.

[Series 3: head wo · axial · 0.43mm/px · z∈[-120,+15]mm · 10 of 33 slices shown, 13 images]
[im 3/33  brain]
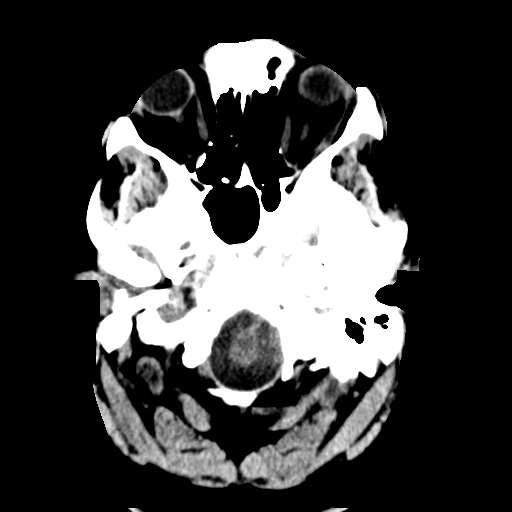
[im 3/33  bone]
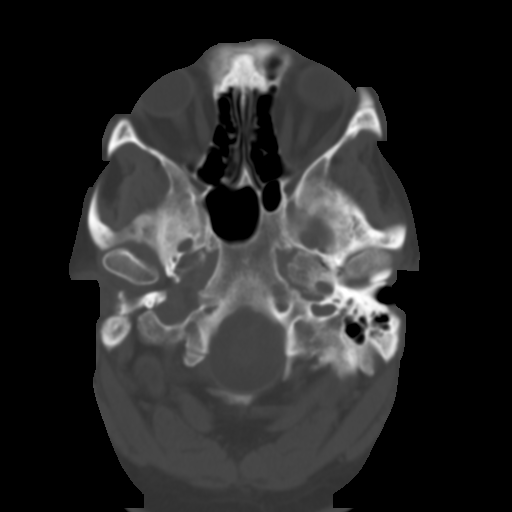
[im 6/33  brain]
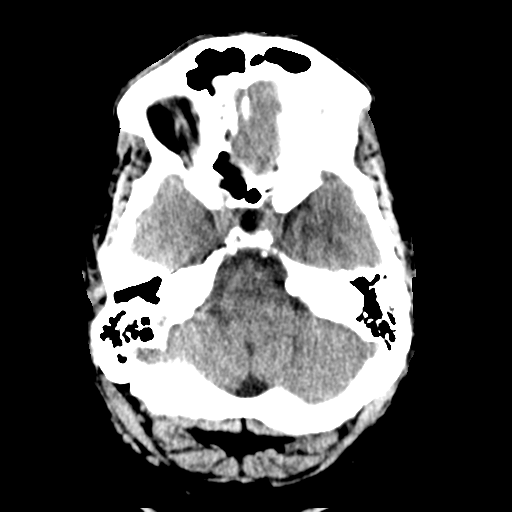
[im 9/33  brain]
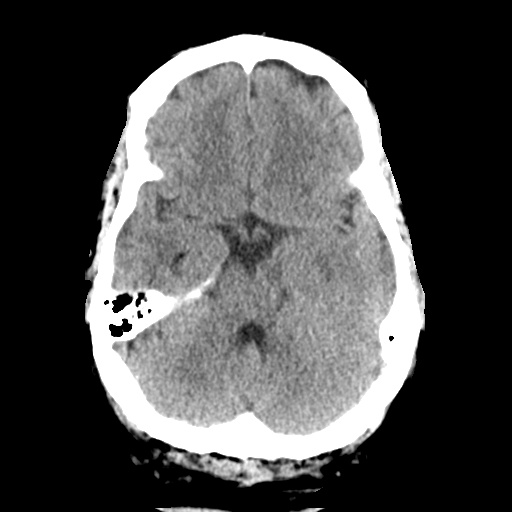
[im 12/33  brain]
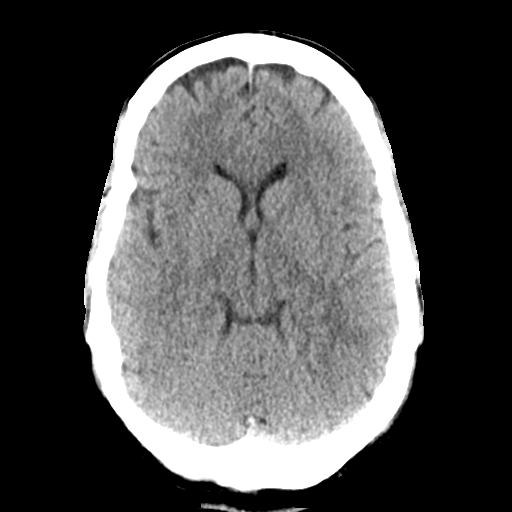
[im 15/33  brain]
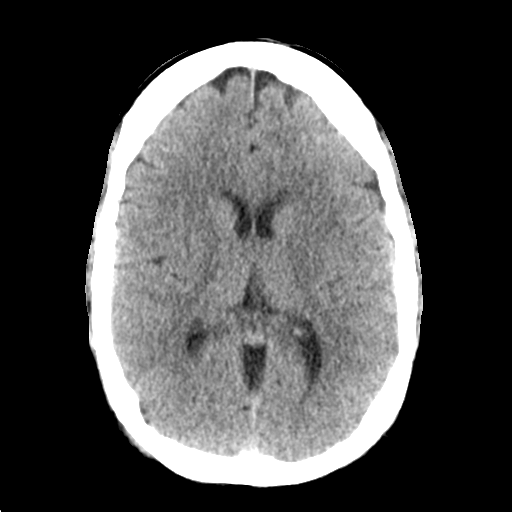
[im 15/33  bone]
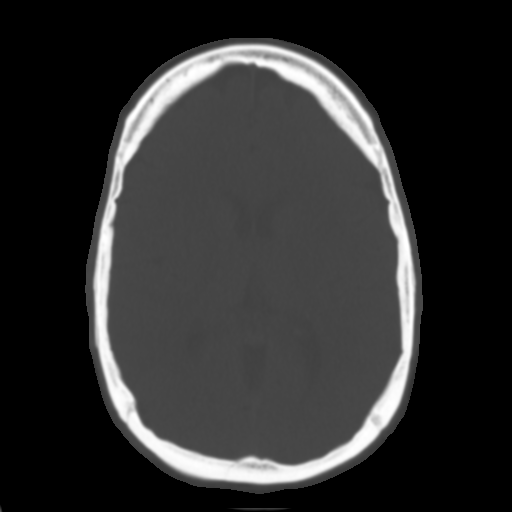
[im 18/33  brain]
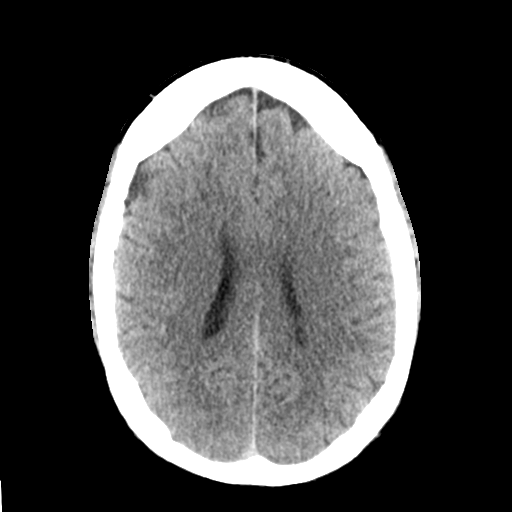
[im 21/33  brain]
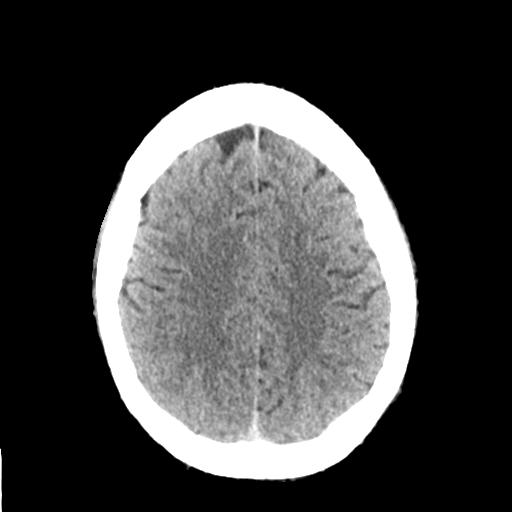
[im 25/33  brain]
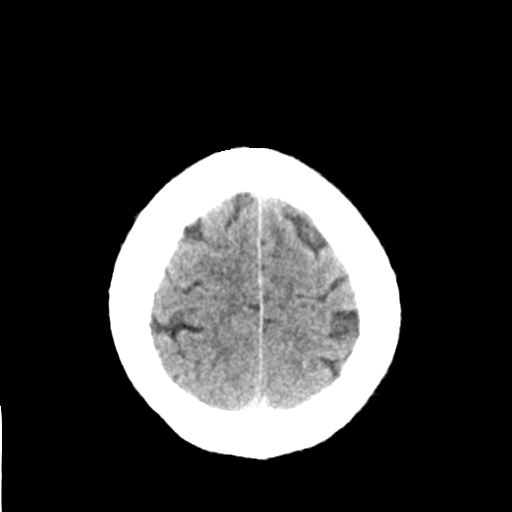
[im 27/33  brain]
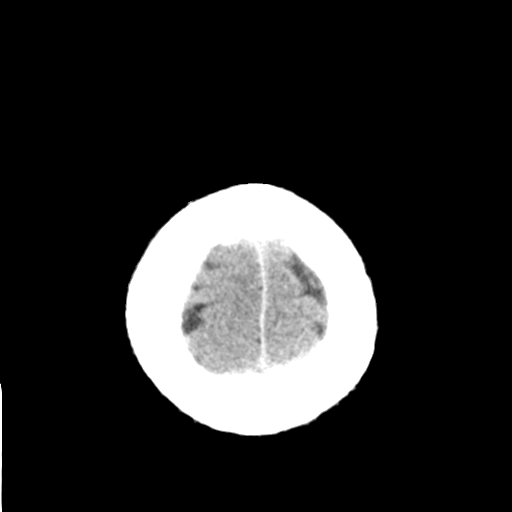
[im 27/33  bone]
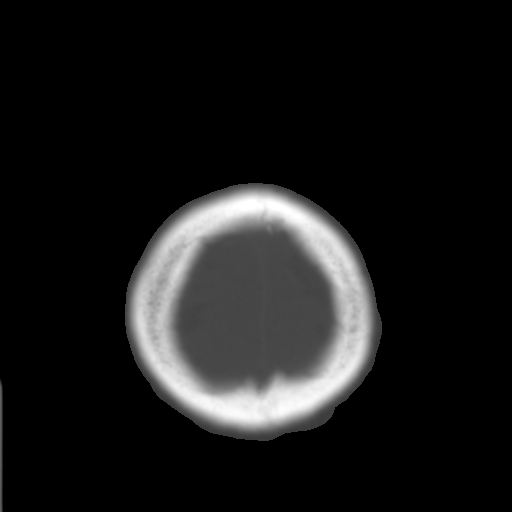
[im 30/33  brain]
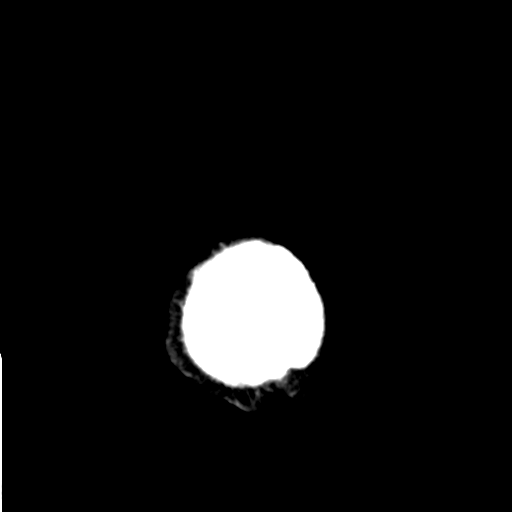

[Series 4: coronal soft tissue · coronal · 0.34mm/px · 3 of 71 slices shown]
[im 24/71  brain]
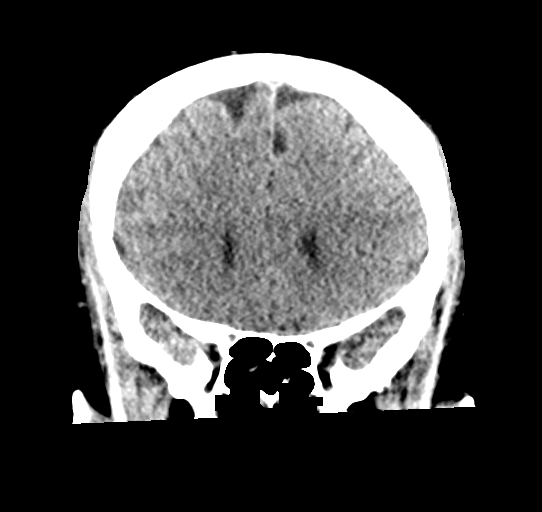
[im 32/71  brain]
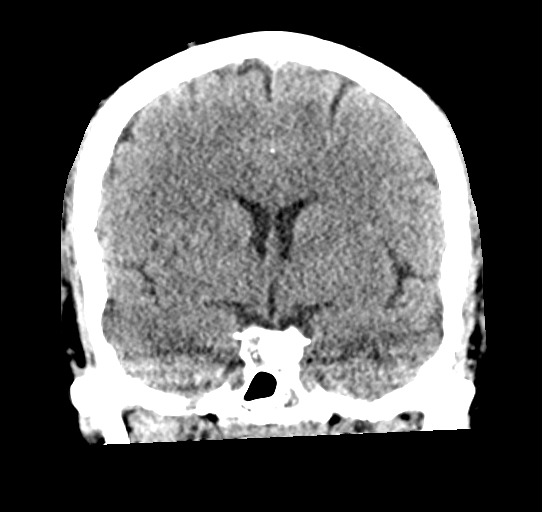
[im 39/71  brain]
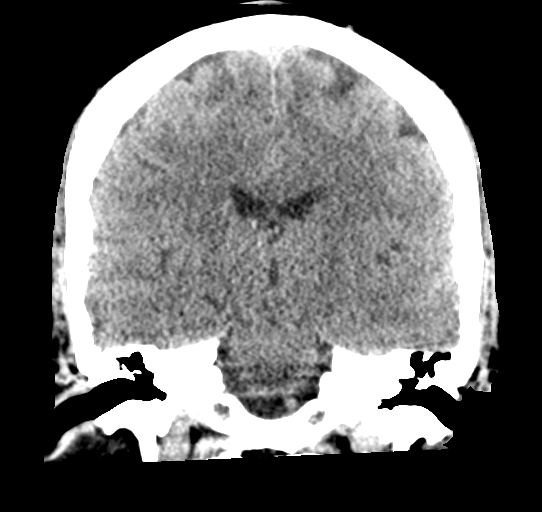

[Series 5: sagittal soft tissue · sagittal · 0.34mm/px · 3 of 61 slices shown]
[im 21/61  brain]
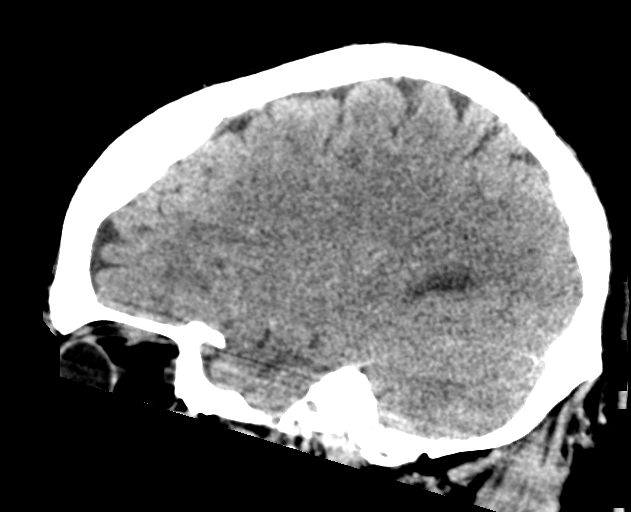
[im 31/61  brain]
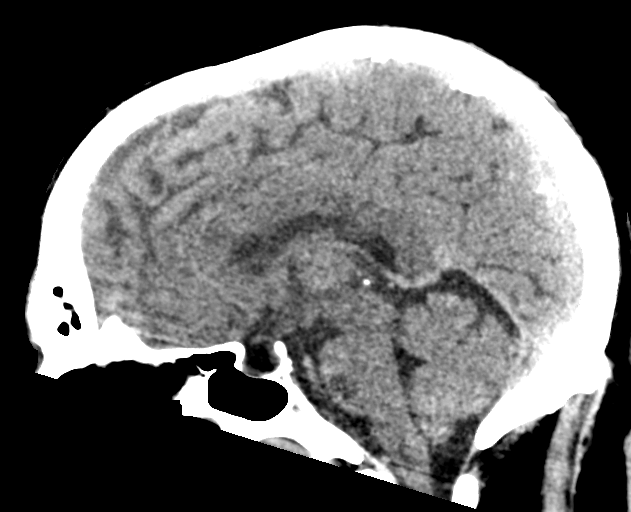
[im 41/61  brain]
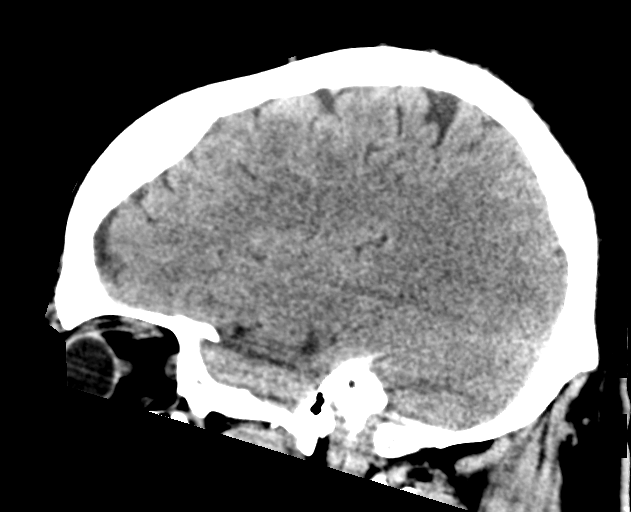

[16 of 47 positions shown; findings below may reference images not displayed]

FINDINGS: Brain: No evidence of acute infarction, hemorrhage, hydrocephalus,
extra-axial collection or mass lesion/mass effect.

Vascular: No hyperdense vessel or unexpected calcification.

Skull: Normal. Negative for fracture or focal lesion.

Sinuses/Orbits: No acute finding.
IMPRESSION: Negative head CT.

## 2023-06-13 ENCOUNTER — Observation Stay
Admission: EM | Admit: 2023-06-13 | Discharge: 2023-06-15 | Disposition: A | Discharge status: Home / Self Care | Attending: Internal Medicine | Admitting: Internal Medicine

## 2023-06-13 ENCOUNTER — Observation Stay

## 2023-06-13 ENCOUNTER — Other Ambulatory Visit: Payer: Self-pay

## 2023-06-13 ENCOUNTER — Emergency Department

## 2023-06-13 ENCOUNTER — Encounter: Payer: Self-pay | Admitting: Emergency Medicine

## 2023-06-13 DIAGNOSIS — E1129 Type 2 diabetes mellitus with other diabetic kidney complication: Secondary | ICD-10-CM | POA: Diagnosis present

## 2023-06-13 DIAGNOSIS — R7989 Other specified abnormal findings of blood chemistry: Secondary | ICD-10-CM | POA: Diagnosis not present

## 2023-06-13 DIAGNOSIS — R569 Unspecified convulsions: Secondary | ICD-10-CM

## 2023-06-13 DIAGNOSIS — I2510 Atherosclerotic heart disease of native coronary artery without angina pectoris: Secondary | ICD-10-CM | POA: Diagnosis present

## 2023-06-13 DIAGNOSIS — D649 Anemia, unspecified: Secondary | ICD-10-CM

## 2023-06-13 DIAGNOSIS — Z7901 Long term (current) use of anticoagulants: Secondary | ICD-10-CM | POA: Diagnosis not present

## 2023-06-13 DIAGNOSIS — Z955 Presence of coronary angioplasty implant and graft: Secondary | ICD-10-CM | POA: Insufficient documentation

## 2023-06-13 DIAGNOSIS — R55 Syncope and collapse: Secondary | ICD-10-CM | POA: Diagnosis not present

## 2023-06-13 DIAGNOSIS — E538 Deficiency of other specified B group vitamins: Secondary | ICD-10-CM | POA: Insufficient documentation

## 2023-06-13 DIAGNOSIS — E1122 Type 2 diabetes mellitus with diabetic chronic kidney disease: Secondary | ICD-10-CM | POA: Insufficient documentation

## 2023-06-13 DIAGNOSIS — Z79899 Other long term (current) drug therapy: Secondary | ICD-10-CM | POA: Diagnosis not present

## 2023-06-13 DIAGNOSIS — Z950 Presence of cardiac pacemaker: Secondary | ICD-10-CM | POA: Insufficient documentation

## 2023-06-13 DIAGNOSIS — Z8673 Personal history of transient ischemic attack (TIA), and cerebral infarction without residual deficits: Secondary | ICD-10-CM | POA: Insufficient documentation

## 2023-06-13 DIAGNOSIS — N185 Chronic kidney disease, stage 5: Secondary | ICD-10-CM | POA: Diagnosis present

## 2023-06-13 DIAGNOSIS — D631 Anemia in chronic kidney disease: Secondary | ICD-10-CM | POA: Insufficient documentation

## 2023-06-13 DIAGNOSIS — Z95810 Presence of automatic (implantable) cardiac defibrillator: Secondary | ICD-10-CM | POA: Insufficient documentation

## 2023-06-13 DIAGNOSIS — Z794 Long term (current) use of insulin: Secondary | ICD-10-CM | POA: Insufficient documentation

## 2023-06-13 DIAGNOSIS — I132 Hypertensive heart and chronic kidney disease with heart failure and with stage 5 chronic kidney disease, or end stage renal disease: Secondary | ICD-10-CM | POA: Insufficient documentation

## 2023-06-13 DIAGNOSIS — M109 Gout, unspecified: Secondary | ICD-10-CM | POA: Insufficient documentation

## 2023-06-13 DIAGNOSIS — E785 Hyperlipidemia, unspecified: Secondary | ICD-10-CM | POA: Insufficient documentation

## 2023-06-13 DIAGNOSIS — I482 Chronic atrial fibrillation, unspecified: Secondary | ICD-10-CM | POA: Diagnosis present

## 2023-06-13 DIAGNOSIS — I5032 Chronic diastolic (congestive) heart failure: Secondary | ICD-10-CM | POA: Diagnosis present

## 2023-06-13 HISTORY — DX: Disorder of kidney and ureter, unspecified: N28.9

## 2023-06-13 LAB — BASIC METABOLIC PANEL WITH GFR
Anion gap: 9 (ref 5–15)
Anion gap: 8 (ref 5–15)
BUN: 70 mg/dL — ABNORMAL HIGH (ref 6–20)
BUN: 69 mg/dL — ABNORMAL HIGH (ref 6–20)
CO2: 23 mmol/L (ref 22–32)
CO2: 20 mmol/L — ABNORMAL LOW (ref 22–32)
Calcium: 8.6 mg/dL — ABNORMAL LOW (ref 8.9–10.3)
Calcium: 8.3 mg/dL — ABNORMAL LOW (ref 8.9–10.3)
Chloride: 111 mmol/L (ref 98–111)
Chloride: 111 mmol/L (ref 98–111)
Creatinine, Ser: 4.81 mg/dL — ABNORMAL HIGH (ref 0.61–1.24)
Creatinine, Ser: 4.51 mg/dL — ABNORMAL HIGH (ref 0.61–1.24)
GFR, Estimated: 14 mL/min — ABNORMAL LOW (ref >60)
GFR, Estimated: 15 mL/min — ABNORMAL LOW (ref >60)
Glucose, Bld: 170 mg/dL — ABNORMAL HIGH (ref 70–99)
Glucose, Bld: 152 mg/dL — ABNORMAL HIGH (ref 70–99)
Potassium: 4.8 mmol/L (ref 3.5–5.1)
Potassium: 4.6 mmol/L (ref 3.5–5.1)
Sodium: 143 mmol/L (ref 135–145)
Sodium: 139 mmol/L (ref 135–145)

## 2023-06-13 LAB — VITAMIN B12: Vitamin B-12: 186 pg/mL (ref 180–914)

## 2023-06-13 LAB — IRON AND TIBC
Iron: 74 ug/dL (ref 45–182)
Saturation Ratios: 34 % (ref 17.9–39.5)
TIBC: 217 ug/dL — ABNORMAL LOW (ref 250–450)
UIBC: 143 ug/dL

## 2023-06-13 LAB — HIV ANTIBODY (ROUTINE TESTING W REFLEX): HIV Screen 4th Generation wRfx: NONREACTIVE

## 2023-06-13 LAB — CBC WITH DIFFERENTIAL/PLATELET
Abs Immature Granulocytes: 0.02 10*3/uL (ref 0.00–0.07)
Basophils Absolute: 0 10*3/uL (ref 0.0–0.1)
Basophils Relative: 0 %
Eosinophils Absolute: 0.2 10*3/uL (ref 0.0–0.5)
Eosinophils Relative: 2 %
HCT: 30 % — ABNORMAL LOW (ref 39.0–52.0)
Hemoglobin: 9.6 g/dL — ABNORMAL LOW (ref 13.0–17.0)
Immature Granulocytes: 0 %
Lymphocytes Relative: 11 %
Lymphs Abs: 0.8 10*3/uL (ref 0.7–4.0)
MCH: 30 pg (ref 26.0–34.0)
MCHC: 32 g/dL (ref 30.0–36.0)
MCV: 93.8 fL (ref 80.0–100.0)
Monocytes Absolute: 0.5 10*3/uL (ref 0.1–1.0)
Monocytes Relative: 7 %
Neutro Abs: 5.2 10*3/uL (ref 1.7–7.7)
Neutrophils Relative %: 80 %
Platelets: 199 10*3/uL (ref 150–400)
RBC: 3.2 MIL/uL — ABNORMAL LOW (ref 4.22–5.81)
RDW: 12.2 % (ref 11.5–15.5)
WBC: 6.7 10*3/uL (ref 4.0–10.5)
nRBC: 0 % (ref 0.0–0.2)

## 2023-06-13 LAB — HEMOGLOBIN A1C
Hgb A1c MFr Bld: 6.1 % — ABNORMAL HIGH (ref 4.8–5.6)
Mean Plasma Glucose: 128 mg/dL

## 2023-06-13 LAB — TROPONIN I (HIGH SENSITIVITY)
Troponin I (High Sensitivity): 35 ng/L — ABNORMAL HIGH (ref <18)
Troponin I (High Sensitivity): 46 ng/L — ABNORMAL HIGH (ref <18)
Troponin I (High Sensitivity): 63 ng/L — ABNORMAL HIGH (ref <18)
Troponin I (High Sensitivity): 57 ng/L — ABNORMAL HIGH (ref <18)

## 2023-06-13 LAB — MAGNESIUM
Magnesium: 2.3 mg/dL (ref 1.7–2.4)
Magnesium: 2.3 mg/dL (ref 1.7–2.4)

## 2023-06-13 LAB — GLUCOSE, CAPILLARY
Glucose-Capillary: 170 mg/dL — ABNORMAL HIGH (ref 70–99)
Glucose-Capillary: 109 mg/dL — ABNORMAL HIGH (ref 70–99)
Glucose-Capillary: 112 mg/dL — ABNORMAL HIGH (ref 70–99)

## 2023-06-13 LAB — FERRITIN: Ferritin: 341 ng/mL — ABNORMAL HIGH (ref 24–336)

## 2023-06-13 LAB — TSH: TSH: 2.293 u[IU]/mL (ref 0.350–4.500)

## 2023-06-13 MED ORDER — SENNOSIDES-DOCUSATE SODIUM 8.6-50 MG PO TABS: 1.0000 | ORAL_TABLET | Freq: Every evening | ORAL | Status: DC | PRN

## 2023-06-13 MED ORDER — INSULIN ASPART 100 UNIT/ML IJ SOLN
0.0000 [IU] | Freq: Every day | INTRAMUSCULAR | Status: DC
  Administered 2023-06-14: 2 [IU] via SUBCUTANEOUS
  Filled 2023-06-13: qty 1

## 2023-06-13 MED ORDER — ONDANSETRON HCL 4 MG PO TABS
4.0000 mg | ORAL_TABLET | Freq: Four times a day (QID) | ORAL | Status: DC | PRN
Start: 2023-06-13 — End: 2023-06-15

## 2023-06-13 MED ORDER — ALLOPURINOL 100 MG PO TABS: 50.0000 mg | ORAL_TABLET | ORAL | Status: DC

## 2023-06-13 MED ORDER — CYANOCOBALAMIN 1000 MCG/ML IJ SOLN
1000.0000 ug | Freq: Once | INTRAMUSCULAR | Status: AC
  Administered 2023-06-13: 1000 ug via INTRAMUSCULAR
  Filled 2023-06-13: qty 1

## 2023-06-13 MED ORDER — ATORVASTATIN CALCIUM 20 MG PO TABS
40.0000 mg | ORAL_TABLET | Freq: Every day | ORAL | Status: DC
  Administered 2023-06-14 – 2023-06-15 (×2): 40 mg via ORAL
  Filled 2023-06-13 (×2): qty 2

## 2023-06-13 MED ORDER — HEPARIN SODIUM (PORCINE) 5000 UNIT/ML IJ SOLN: 5000.0000 [IU] | Freq: Three times a day (TID) | INTRAMUSCULAR | Status: DC

## 2023-06-13 MED ORDER — VITAMIN B-12 1000 MCG PO TABS
1000.0000 ug | ORAL_TABLET | Freq: Every day | ORAL | Status: DC
  Administered 2023-06-14 – 2023-06-15 (×2): 1000 ug via ORAL
  Filled 2023-06-13 (×2): qty 1

## 2023-06-13 MED ORDER — TORSEMIDE 20 MG PO TABS
100.0000 mg | ORAL_TABLET | Freq: Every day | ORAL | Status: DC
  Administered 2023-06-14 – 2023-06-15 (×2): 100 mg via ORAL
  Filled 2023-06-13 (×2): qty 5

## 2023-06-13 MED ORDER — CARVEDILOL 6.25 MG PO TABS
12.5000 mg | ORAL_TABLET | Freq: Two times a day (BID) | ORAL | Status: DC
  Administered 2023-06-13 – 2023-06-15 (×4): 12.5 mg via ORAL
  Filled 2023-06-13 (×4): qty 2

## 2023-06-13 MED ORDER — ONDANSETRON HCL 4 MG/2ML IJ SOLN: 4.0000 mg | Freq: Four times a day (QID) | INTRAMUSCULAR | Status: DC | PRN

## 2023-06-13 MED ORDER — ACETAMINOPHEN 325 MG PO TABS: 650.0000 mg | ORAL_TABLET | Freq: Four times a day (QID) | ORAL | Status: DC | PRN

## 2023-06-13 MED ORDER — APIXABAN 5 MG PO TABS
5.0000 mg | ORAL_TABLET | Freq: Two times a day (BID) | ORAL | Status: DC
Start: 2023-06-13 — End: 2023-06-15
  Administered 2023-06-13 – 2023-06-15 (×4): 5 mg via ORAL
  Filled 2023-06-13 (×4): qty 1

## 2023-06-13 MED ORDER — INSULIN ASPART 100 UNIT/ML IJ SOLN
0.0000 [IU] | Freq: Three times a day (TID) | INTRAMUSCULAR | Status: DC
  Administered 2023-06-13 – 2023-06-15 (×3): 3 [IU] via SUBCUTANEOUS
  Filled 2023-06-13 (×3): qty 1

## 2023-06-13 MED ORDER — AMIODARONE HCL 200 MG PO TABS
200.0000 mg | ORAL_TABLET | Freq: Every day | ORAL | Status: DC
  Administered 2023-06-13 – 2023-06-15 (×3): 200 mg via ORAL
  Filled 2023-06-13 (×3): qty 1

## 2023-06-13 MED ORDER — ACETAMINOPHEN 650 MG RE SUPP: 650.0000 mg | Freq: Four times a day (QID) | RECTAL | Status: DC | PRN

## 2023-06-13 MED ORDER — HYDRALAZINE HCL 50 MG PO TABS
50.0000 mg | ORAL_TABLET | Freq: Three times a day (TID) | ORAL | Status: DC
Start: 2023-06-13 — End: 2023-06-15
  Administered 2023-06-13 – 2023-06-15 (×5): 50 mg via ORAL
  Filled 2023-06-13 (×5): qty 1

## 2023-06-13 NOTE — Progress Notes (Signed)
 Eeg done

## 2023-06-13 NOTE — H&P (Addendum)
 History and Physical  Terry [name] UJW:[id number] DOB: 1975/05/10 DOA: 06/13/2023  PCP: Vicente Graham, No   Chief Complaint: Syncope  HPI: Terry [name] is a 49 y.o. 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 Patient reports he started feeling dizzy and short of breath while going up the stairs so he tried to sit down.  Per son, when patient sat down, he started gasping for air and stoppeed breathing for a second.  Patient's body tensed up and his eyes rolled back before patient regained consciousness.  The episodes lasted less than 1 minute. Patient reports when he woke up, he initially had some difficulty speaking but did not feel confused and knew where he was. He endorsed some mild bilateral shoulder tightness and headache but denies any chest pain, cough, fevers, chills, nausea, vomiting, vision changes or palpitations. He denies any history of seizures.  ED course: Initial vitals shows patient afebrile but slightly hypertensive with SBP in the 140s to 160s, SpO2 100% on room air.  Labs were significant for Hgb 9.6, WBC 6.7, BUN/creatinine 70/4.81 sodium 143, K+ 4.8, troponin 35->46, mag 2.3.  EKG shows sinus rhythm with PVC. CXR with no acute cardiopulmonary disease. TRH was consulted for admission.   Review of Systems: Please see HPI for pertinent positives and negatives. A complete 10 system review of systems are otherwise negative.  Past Medical History:  Diagnosis Date   AICD (automatic cardioverter/defibrillator) present    CHF (congestive heart failure) (HCC)    Coronary artery disease    Diabetes mellitus without complication (HCC)    Dysrhythmia    AFIB   Palpitations    Presence of permanent cardiac pacemaker    PACEMAKER / DEFIB   Renal  disorder    Past Surgical History:  Procedure Laterality Date   ABLATION     CARDIAC   CARDIAC DEFIBRILLATOR PLACEMENT     CATARACT EXTRACTION W/PHACO Right 02/15/2017   Procedure: CATARACT EXTRACTION PHACO AND INTRAOCULAR LENS PLACEMENT (IOC);  Surgeon: Clair Crews, MD;  Location: [facility] ORS;  Service: Ophthalmology;  Laterality: Right;  US  00:48 AP% 14.8 CDE 7.11 Fluid pack lot # [id number]   CATARACT EXTRACTION W/PHACO Left 03/15/2017   Procedure: CATARACT EXTRACTION PHACO AND INTRAOCULAR LENS PLACEMENT (IOC);  Surgeon: Clair Crews, MD;  Location: [facility] ORS;  Service: Ophthalmology;  Laterality: Left;  US  00:46.0 AP% 11.2 CDE 5.15 FLUID PACK LOT # [id number]   CORONARY ANGIOPLASTY     INSERT / REPLACE / REMOVE PACEMAKER     Social History:  reports that he has never smoked. He has never used smokeless tobacco. He reports that he does not drink alcohol and does not use drugs.  No Known Allergies  Family History  Problem Relation Age of Onset   Ovarian cancer Mother    Diabetes Mellitus II Father    Heart disease Father    Hypertension Father      Prior to Admission medications   Medication Sig Start Date End Date Taking? Authorizing Provider  allopurinol (ZYLOPRIM) 100 MG tablet Take 50 mg by mouth 2 (two) times a week. 03/24/23 03/23/24 Yes [provider]  amiodarone (PACERONE) 200 MG tablet Take 1 tablet by mouth daily. 03/01/23  Yes [provider]  apixaban (ELIQUIS) 5 MG  TABS tablet Take 5 mg by mouth 2 (two) times daily. 01/21/23  Yes [provider]  carvedilol (COREG) 12.5 MG tablet Take 12.5 mg by mouth in the morning and at bedtime. 01/03/23 01/03/24 Yes [provider]  Dulaglutide (TRULICITY) 1.5 MG/0.5ML SOAJ Inject 1.5 mg into the skin every 7 (seven) days. 02/11/22  Yes [provider]  hydrALAZINE (APRESOLINE) 50 MG tablet Take 50 mg by mouth 3 (three) times daily. 03/22/23 03/21/24 Yes [provider]   insulin  aspart (NOVOLOG ) 100 unit/mL injection Inject 10 Units into the skin 3 (three) times daily before meals. 08/31/19  Yes [provider]  LANTUS  SOLOSTAR 100 UNIT/ML Solostar Pen Inject 33 Units into the skin at bedtime. 03/22/23  Yes [provider]  metolazone (ZAROXOLYN) 2.5 MG tablet Take 2.5 mg by mouth 3 (three) times a week. 03/31/23  Yes [provider]  torsemide (DEMADEX) 100 MG tablet Take 1 tablet by mouth daily. 03/31/23 03/30/24 Yes [provider]  atorvastatin  (LIPITOR) 40 MG tablet Take 40 mg by mouth daily.    [provider]  blood glucose meter kit and supplies KIT Dispense based on patient and insurance preference. Use up to four times daily as directed. (FOR ICD-9 250.00, 250.01). 08/31/19   Alphonsus Jeans, MD  hydrochlorothiazide (HYDRODIURIL) 25 MG tablet Take 25 mg by mouth daily.    [provider]  insulin  aspart (NOVOLOG ) 100 UNIT/ML injection Inject 5 Units into the skin 3 (three) times daily with meals. 08/31/19 09/30/19  Alphonsus Jeans, MD  insulin  NPH Human (NOVOLIN N) 100 UNIT/ML injection 20 units daily 08/31/19 08/30/20  Alphonsus Jeans, MD  Insulin  Syringe-Needle U-100 25G X 5/8" 1 ML MISC Needs enough insulin  needles & syringes for insulin  aspart 5 units TID w/ meals & insulin  NPH 20 units daily for 30 days 08/31/19   Alphonsus Jeans, MD    Physical Exam: BP (!) 173/86 (BP Location: Left Arm)   Pulse 73   Temp (!) 97.5 F (36.4 C)   Resp 18   Ht 5\' 8"  (1.727 m)   Wt 131.5 kg   SpO2 100%   BMI 44.09 kg/m  General: Pleasant, well-appearing well-appearing obese man laying in bed. No acute distress. HEENT: [place]/AT. Anicteric sclera CV: Regular rate. Regular rhythm with occasional extra beats. No murmurs, rubs, or gallops. Trace BLE edema. Pulmonary: Lungs CTAB. Normal effort. No wheezing or rales. Abdominal: Soft, nontender, nondistended. Normal bowel sounds. Extremities: Palpable radial  and DP pulses. Normal ROM. RUE AVF.  Skin: Warm and dry. No obvious rash or lesions. Neuro: A&Ox3. Moves all extremities. Normal sensation to light touch. No focal deficit. Psych: Normal mood and affect          Labs on Admission:  Basic Metabolic Panel: Recent Labs  Lab 06/13/23 0839  NA 143  K 4.8  CL 111  CO2 23  GLUCOSE 170*  BUN 70*  CREATININE 4.81*  CALCIUM  8.6*  MG 2.3   Liver Function Tests: No results for input(s): "AST", "ALT", "ALKPHOS", "BILITOT", "PROT", "ALBUMIN" in the last 168 hours. No results for input(s): "LIPASE", "AMYLASE" in the last 168 hours. No results for input(s): "AMMONIA" in the last 168 hours. CBC: Recent Labs  Lab 06/13/23 0839  WBC 6.7  NEUTROABS 5.2  HGB 9.6*  HCT 30.0*  MCV 93.8  PLT 199   Cardiac Enzymes: No results for input(s): "CKTOTAL", "CKMB", "CKMBINDEX", "TROPONINI" in the last 168 hours. BNP (last 3 results) No  results for input(s): "BNP" in the last 8760 hours.  ProBNP (last 3 results) No results for input(s): "PROBNP" in the last 8760 hours.  CBG: Recent Labs  Lab 06/13/23 1227 06/13/23 1624  GLUCAP 170* 109*    Radiological Exams on Admission: EEG adult Result Date: 06/13/2023 Arleene Lack, MD     06/13/2023  3:50 PM Patient Name: Terry [name] MRN: [id number] Epilepsy Attending: Arleene Lack Referring Physician/Provider: Vita Grip, MD Date: 06/13/2023 Duration: 38.41 mins Patient history: 48yo M with syncope. EEG to evaluate for seizure Level of alertness: Awake, asleep AEDs during EEG study: None Technical aspects: This EEG study was done with scalp electrodes positioned according to the 10-20 International system of electrode placement. Electrical activity was reviewed with band pass filter of 1-70Hz , sensitivity of 7 uV/mm, display speed of 55mm/sec with a 60Hz  notched filter applied as appropriate. EEG data were recorded continuously and digitally stored.  Video monitoring was available and  reviewed as appropriate. Description: The posterior dominant rhythm consists of 8-9 Hz activity of moderate voltage (25-35 uV) seen predominantly in posterior head regions, symmetric and reactive to eye opening and eye closing. Sleep was characterized by vertex waves, sleep spindles (12 to 14 Hz), maximal frontocentral region.   Hyperventilation and photic stimulation were not performed.   IMPRESSION: This study is within normal limits. No seizures or epileptiform discharges were seen throughout the recording. A normal interictal EEG does not exclude the diagnosis of epilepsy. Arleene Lack   DG Chest 2 View Result Date: 06/13/2023 CLINICAL DATA:  Shortness of breath and syncope EXAM: CHEST - 2 VIEW COMPARISON:  Chest radiograph dated 05/10/2010 FINDINGS: Lines/tubes: Left chest wall ICD lead projects over the right ventricle. Lungs: Well inflated lungs. No focal consolidation. Pleura: No pneumothorax or pleural effusion. Heart/mediastinum: Similar mildly enlarged cardiomediastinal silhouette. Bones: No acute osseous abnormality. IMPRESSION: 1. No active cardiopulmonary disease. 2. Similar mild cardiomegaly. Electronically Signed   By: Limin  Xu M.D.   On: 06/13/2023 08:47   Assessment/Plan Terry [name] is a 48 y.o. 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   # Syncope - Unclear cause at the moment - BP actually elevated, patient not dry on exam to indicate hypovolemia - EKG does not show any concerning changes, ICD interrogated by EDP did not show any acute changes - EEG within normal limits with no seizure or epileptiform discharges. - Due to cardiac history and elevated troponin (25->46->63), concern for possible cardiac cause - Admit for close observation  on telemetry - Check echocardiogram and orthostatic vitals - Trend troponin to peaked or flat  # Nonischemic cardiomyopathy s/p AICD placement # HFimpEF - Last TTE in July 2025 showed EF 50-55% - Patient euvolemic on exam - Continue torsemide and Coreg  # Paroxysmal A-fib - Status post ablation x 2, now in normal sinus - HR in the 70s to 80s, no palpitations - Continue Eliquis, Coreg and amiodarone - Trend and replete electrolytes - Telemetry  # HTN - BP elevated with SBP in the 140s to 170s - Continue Coreg and hydralazine  # CKD stage V - Currently not on HD, BUN/creatinine 70/4.81 - RUE AV fistula placed on 04/28/2023 - No urgent indication for HD at the moment -  Trend renal function avoid nephrotoxic's agents  # Type 2 diabetes - Last A1c 8.8% in January 2025 - CBG initially 170 but down to 109 this evening - SSI with CBG monitoring for now - Follow-up repeat A1c  # Normocytic anemia # Anemia of chronic disease - Hgb of 9.6, from 11.9 in July 2021 - Iron studies indicate anemia of chronic disease - Trend CBC  # Vitamin B12 deficiency - Patient with low normal vitamin B12 of 186 - IM vitamin B 1000 mcg x 1 - Start oral vitamin B12 supplementation tomorrow  # HLD - Continue atorvastatin   # Gout - Continue allopurinol   DVT prophylaxis: Eliquis    Code Status: Full Code  Consults called: None  Family Communication: Discussed admission with son at bedside  Severity of Illness: The appropriate patient status for this patient is OBSERVATION. Observation status is judged to be reasonable and necessary in order to provide the required intensity of service to ensure the patient's safety. The patient's presenting symptoms, physical exam findings, and initial radiographic and laboratory data in the context of their medical condition is felt to place them at decreased risk for further clinical deterioration. Furthermore, it is anticipated that the patient will be  medically stable for discharge from the hospital within 2 midnights of admission.   Level of care: Telemetry Medical   This record has been created using Conservation officer, historic buildings. Errors have been sought and corrected, but may not always be located. Such creation errors do not reflect on the standard of care.   Vita Grip, MD 06/13/2023, 6:29 PM [facility] Pager: [phone] Isaiah 41:10   If 7PM-7AM, please contact night-coverage [email or web address] Password [email or web address]

## 2023-06-13 NOTE — ED Triage Notes (Signed)
 Presents via EMS from home  States he was going up the steps  Had a syncopal episode   Denies any chest pain but is having bilateral shoulder pain Hx of defibrillator

## 2023-06-13 NOTE — Procedures (Signed)
 Patient Name: Terry [name]  MRN: [id number]  Epilepsy Attending: Arleene Lack  Referring Physician/Provider: Vita Grip, MD  Date: 06/13/2023 Duration: 38.41 mins  Patient history: 48yo M with syncope. EEG to evaluate for seizure  Level of alertness: Awake, asleep  AEDs during EEG study: None  Technical aspects: This EEG study was done with scalp electrodes positioned according to the 10-20 International system of electrode placement. Electrical activity was reviewed with band pass filter of 1-70Hz , sensitivity of 7 uV/mm, display speed of 36mm/sec with a 60Hz  notched filter applied as appropriate. EEG data were recorded continuously and digitally stored.  Video monitoring was available and reviewed as appropriate.  Description: The posterior dominant rhythm consists of 8-9 Hz activity of moderate voltage (25-35 uV) seen predominantly in posterior head regions, symmetric and reactive to eye opening and eye closing. Sleep was characterized by vertex waves, sleep spindles (12 to 14 Hz), maximal frontocentral region.   Hyperventilation and photic stimulation were not performed.     IMPRESSION: This study is within normal limits. No seizures or epileptiform discharges were seen throughout the recording.  A normal interictal EEG does not exclude the diagnosis of epilepsy.   Alixandrea Milleson O Koula Venier

## 2023-06-13 NOTE — ED Provider Notes (Signed)
 [facility] Provider Note    Event Date/Time   First MD Initiated Contact with Patient 06/13/23 0802     (approximate)   History   Chief Complaint Near Syncope   HPI  Terry [name] is a 48 y.o. male with past medical history of hypertension, diabetes, CAD, CHF, atrial fibrillation on Eliquis, and CKD who presents to the ED complaining of syncope.  Patient reports that he woke up feeling well this morning, began to feel out of breath when walking up the stairs shortly afterwards.  He started to feel lightheaded, was able to sit down on the couch, but does not remember what happened next.  He believes he passed out and woke up a few minutes later, EMS was subsequently contacted to bring him to the ED.  He denies any chest pain since this episode, states that shortness of breath has resolved but he feels very tired and fatigued.  He denies any recent fevers or cough, has chronic swelling in his legs that is unchanged from usual.  He recently had a fistula placed in his right arm, has not yet started on dialysis.     Physical Exam   Triage Vital Signs: ED Triage Vitals  Encounter Vitals Group     BP 06/13/23 0752 (!) 140/93     Systolic BP Percentile --      Diastolic BP Percentile --      Pulse Rate 06/13/23 0752 77     Resp 06/13/23 0752 18     Temp 06/13/23 0752 98 F (36.7 C)     Temp Source 06/13/23 0752 Oral     SpO2 06/13/23 0752 100 %     Weight 06/13/23 0752 290 lb (131.5 kg)     Height 06/13/23 0752 5\' 8"  (1.727 m)     Head Circumference --      Peak Flow --      Pain Score 06/13/23 0747 7     Pain Loc --      Pain Education --      Exclude from Growth Chart --     Most recent vital signs: Vitals:   06/13/23 0752 06/13/23 0830  BP: (!) 140/93 (!) 167/97  Pulse: 77 78  Resp: 18 16  Temp: 98 F (36.7 C)   SpO2: 100% 99%    Constitutional: Alert and oriented. Eyes: Conjunctivae are normal. Head: Atraumatic. Nose: No  congestion/rhinnorhea. Mouth/Throat: Mucous membranes are moist.  Cardiovascular: Normal rate, regular rhythm. Grossly normal heart sounds.  2+ radial pulses bilaterally.  Right upper extremity AV fistula with palpable thrill. Respiratory: Normal respiratory effort.  No retractions. Lungs CTAB. Gastrointestinal: Soft and nontender. No distention. Musculoskeletal: No lower extremity tenderness, 1+ pitting edema to mid shins bilaterally. Neurologic:  Normal speech and language. No gross focal neurologic deficits are appreciated.    ED Results / Procedures / Treatments   Labs (all labs ordered are listed, but only abnormal results are displayed) Labs Reviewed  CBC WITH DIFFERENTIAL/PLATELET - Abnormal; Notable for the following components:      Result Value   RBC 3.20 (*)    Hemoglobin 9.6 (*)    HCT 30.0 (*)    All other components within normal limits  BASIC METABOLIC PANEL WITH GFR - Abnormal; Notable for the following components:   Glucose, Bld 170 (*)    BUN 70 (*)    Creatinine, Ser 4.81 (*)    Calcium  8.6 (*)    GFR, Estimated 14 (*)  All other components within normal limits  TROPONIN I (HIGH SENSITIVITY) - Abnormal; Notable for the following components:   Troponin I (High Sensitivity) 35 (*)    All other components within normal limits  MAGNESIUM  TROPONIN I (HIGH SENSITIVITY)     EKG  ED ECG REPORT I, Twilla Galea, the attending physician, personally viewed and interpreted this ECG.   Date: 06/13/2023  EKG Time: 8:34  Rate: 76  Rhythm: normal sinus rhythm, occasional PVC noted, unifocal  Axis: RAD  Intervals:none  ST&T Change: None  RADIOLOGY Chest x-ray reviewed and interpreted by me with no infiltrate, edema, or effusion.  PROCEDURES:  Critical Care performed: No  Procedures   MEDICATIONS ORDERED IN ED: Medications - No data to display   IMPRESSION / MDM / ASSESSMENT AND PLAN / ED COURSE  I reviewed the triage vital signs and the nursing  notes.                              48 y.o. male with past medical history of hypertension, diabetes, CAD, CHF, atrial fibrillation on Eliquis, CKD, and stroke who presents to the ED following syncopal episode after walking up the stairs, had some associated shortness of breath that has resolved.  Patient's presentation is most consistent with acute presentation with potential threat to life or bodily function.  Differential diagnosis includes, but is not limited to, arrhythmia, ACS, vasovagal episode, orthostatic hypotension, anemia, electrolyte abnormality, AKI.  Patient nontoxic-appearing and in no acute distress, vital signs are unremarkable.  No signs of trauma and it appears he was sitting on the couch when the episode occurred.  He is high risk for cardiac etiology for syncope, we will screen EKG and observe on cardiac monitor, interrogate his AICD.  Labs and chest x-ray are also pending at this time.  Labs consistent with known CKD, no significant AKI or electrolyte abnormality noted.  No significant anemia or leukocytosis, troponin elevated but appears to be at his baseline.  EKG without evidence of arrhythmia or ischemia, AICD interrogation is pending at this time.  Given patient high risk for cardiac etiology, case discussed with hospitalist for admission.      FINAL CLINICAL IMPRESSION(S) / ED DIAGNOSES   Final diagnoses:  Syncope, unspecified syncope type     Rx / DC Orders   ED Discharge Orders     None        Note:  This document was prepared using Dragon voice recognition software and may include unintentional dictation errors.   Twilla Galea, MD 06/13/23 [phone]

## 2023-06-14 ENCOUNTER — Observation Stay: Admit: 2023-06-14 | Discharge: 2023-06-14 | Disposition: A | Discharge status: Home / Self Care | Attending: Student

## 2023-06-14 DIAGNOSIS — R55 Syncope and collapse: Secondary | ICD-10-CM

## 2023-06-14 LAB — RENAL FUNCTION PANEL
Albumin: 2.9 g/dL — ABNORMAL LOW (ref 3.5–5.0)
Anion gap: 4 — ABNORMAL LOW (ref 5–15)
BUN: 65 mg/dL — ABNORMAL HIGH (ref 6–20)
CO2: 24 mmol/L (ref 22–32)
Calcium: 8.4 mg/dL — ABNORMAL LOW (ref 8.9–10.3)
Chloride: 111 mmol/L (ref 98–111)
Creatinine, Ser: 4.61 mg/dL — ABNORMAL HIGH (ref 0.61–1.24)
GFR, Estimated: 15 mL/min — ABNORMAL LOW (ref >60)
Glucose, Bld: 96 mg/dL (ref 70–99)
Phosphorus: 3.5 mg/dL (ref 2.5–4.6)
Potassium: 4.2 mmol/L (ref 3.5–5.1)
Sodium: 139 mmol/L (ref 135–145)

## 2023-06-14 LAB — CBC
HCT: 29.4 % — ABNORMAL LOW (ref 39.0–52.0)
Hemoglobin: 9.5 g/dL — ABNORMAL LOW (ref 13.0–17.0)
MCH: 29.7 pg (ref 26.0–34.0)
MCHC: 32.3 g/dL (ref 30.0–36.0)
MCV: 91.9 fL (ref 80.0–100.0)
Platelets: 225 10*3/uL (ref 150–400)
RBC: 3.2 MIL/uL — ABNORMAL LOW (ref 4.22–5.81)
RDW: 12 % (ref 11.5–15.5)
WBC: 6.2 10*3/uL (ref 4.0–10.5)
nRBC: 0 % (ref 0.0–0.2)

## 2023-06-14 LAB — GLUCOSE, CAPILLARY
Glucose-Capillary: 102 mg/dL — ABNORMAL HIGH (ref 70–99)
Glucose-Capillary: 159 mg/dL — ABNORMAL HIGH (ref 70–99)
Glucose-Capillary: 102 mg/dL — ABNORMAL HIGH (ref 70–99)
Glucose-Capillary: 144 mg/dL — ABNORMAL HIGH (ref 70–99)

## 2023-06-14 LAB — HEMOGLOBIN A1C
Hgb A1c MFr Bld: 6.4 % — ABNORMAL HIGH (ref 4.8–5.6)
Mean Plasma Glucose: 137 mg/dL

## 2023-06-14 LAB — MAGNESIUM: Magnesium: 2.4 mg/dL (ref 1.7–2.4)

## 2023-06-14 NOTE — Progress Notes (Signed)
 Progress Note    Terry [name]  ZOX:[id number] DOB: [birth year]  DOA: 06/13/2023 PCP: Pcp, No      Brief Narrative:    Medical records reviewed and are as summarized below:  Terry [name] is a 48 y.o. male Terry [name] is a 48 y.o. 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 Patient reports he started feeling dizzy and short of breath while going up the stairs to his son's apartment.  Once he entered the room, he sat down on the couch.  When he sat down, he started gasping for air and stop breathing for a moment.  His body tensed and he was just staring.  This episode lasted about a minute and he regained consciousness immediately.  He was completely aware of his surroundings after regaining consciousness.  All this morning, his son was on the phone with 911 and patient was able to answer questions appropriately according to Zahmir, son.  There was no confusion, urinary incontinence or tongue biting.      Assessment/Plan:   Principal Problem:   Syncope Active Problems:   Chronic diastolic CHF (congestive heart failure) (HCC)   Type II diabetes mellitus with renal manifestations (HCC)   Normocytic anemia    Body mass index is 44.09 kg/m.  (Morbid obesity)   S/p syncope: Probably vasovagal syncope.  Check orthostatic vital signs. AICD interrogation unremarkable EEG did not show any epileptiform activity.  2D echo is pending.   Mildly elevated troponin: Not consistent with ACS.  No chest pain This may be due to chronic myocardial injury since he has chronically elevated troponins.   Chronic diastolic CHF/nonischemic cardiomyopathy, s/p AICD: Compensated   Paroxysmal atrial fibrillation, history of stroke: Continue Eliquis  and amiodarone. History of ablation x 2 in February 2020 for November 2012   Type II DM: Continue NovoLog  Hemoglobin A1c 8.8 in July 2025   CKD stage V: Follow-up with nephrologist as an outpatient   Comorbidities include hypertension, anemia of chronic disease, vitamin B12 deficiency, hyperlipidemia, gout     Diet Order             Diet renal/carb modified with fluid restriction Diet-HS Snack? Nothing; Fluid restriction: 1200 mL Fluid; Room service appropriate? Yes; Fluid consistency: Thin  Diet effective now                            Consultants: None  Procedures: None    Medications:    allopurinol  50 mg Oral Once per day on Monday Thursday   amiodarone  200 mg Oral Daily   apixaban  5 mg Oral BID   atorvastatin   40 mg Oral Daily   carvedilol  12.5 mg Oral BID WC   vitamin B-12  1,000 mcg Oral Daily   hydrALAZINE  50 mg Oral TID   insulin  aspart  0-15 Units Subcutaneous TID WC   insulin  aspart  0-5 Units Subcutaneous QHS   torsemide  100 mg Oral Daily   Continuous Infusions:   Anti-infectives (From admission, onward)    None              Family Communication/Anticipated D/C date and plan/Code Status   DVT prophylaxis:  apixaban (ELIQUIS) tablet 5 mg     Code Status: Full Code  Family Communication: Plan discussed with Zahmir, son, at the bedside Disposition Plan: Plan to discharge home.     Status is: Observation The patient remains OBS appropriate and will d/c before 2 midnights.  Workup is still in progress.  2D echo pending     Subjective:   Interval events noted.  No complaints.  No dizziness shortness of breath, chest pain, palpitations, confusion  Objective:    Vitals:   06/13/23 1952 06/13/23 2359 06/14/23 0450 06/14/23 0730  BP: (!) 155/80 139/80 138/71 (!) 141/81  Pulse: 66 63 67 64  Resp: 16 16 18 15   Temp: 98 F (36.7 C) 98.7 F (37.1 C) 97.9 F (36.6 C) 97.9 F (36.6 C)  TempSrc:  Oral Oral    SpO2: 98% 95% 99% 98%  Weight:      Height:       Orthostatic VS for the past 24 hrs:  BP- Lying Pulse- Lying BP- Sitting Pulse- Sitting BP- Standing at 0 minutes Pulse- Standing at 0 minutes  06/14/23 0829 159/87 72 163/90 73 142/75 66     Intake/Output Summary (Last 24 hours) at 06/14/2023 1125 Last data filed at 06/14/2023 0900 Gross per 24 hour  Intake 360 ml  Output --  Net 360 ml   Filed Weights   06/13/23 0752  Weight: 131.5 kg    Exam:  GEN: NAD SKIN: Warm and dry EYES: No pallor or icterus ENT: MMM CV: RRR PULM: CTA B ABD: soft, obese, NT, +BS CNS: AAO x 3, non focal EXT: No edema or tenderness        Data Reviewed:   I have personally reviewed following labs and imaging studies:  Labs: Labs show the following:   Basic Metabolic Panel: Recent Labs  Lab 06/13/23 0839 06/13/23 2146 06/14/23 0403  NA 143 139 139  K 4.8 4.6 4.2  CL 111 111 111  CO2 23 20* 24  GLUCOSE 170* 152* 96  BUN 70* 69* 65*  CREATININE 4.81* 4.51* 4.61*  CALCIUM  8.6* 8.3* 8.4*  MG 2.3 2.3 2.4  PHOS  --   --  3.5   GFR Estimated Creatinine Clearance: 25.4 mL/min (A) (by C-G formula based on SCr of 4.61 mg/dL (H)). Liver Function Tests: Recent Labs  Lab 06/14/23 0403  ALBUMIN 2.9*   No results for input(s): "LIPASE", "AMYLASE" in the last 168 hours. No results for input(s): "AMMONIA" in the last 168 hours. Coagulation profile No results for input(s): "INR", "PROTIME" in the last 168 hours.  CBC: Recent Labs  Lab 06/13/23 0839 06/14/23 0403  WBC 6.7 6.2  NEUTROABS 5.2  --   HGB 9.6* 9.5*  HCT 30.0* 29.4*  MCV 93.8 91.9  PLT 199 225   Cardiac Enzymes: No results for input(s): "CKTOTAL", "CKMB", "CKMBINDEX", "TROPONINI" in the last 168 hours. BNP (last 3 results) No results for input(s): "PROBNP" in the last 8760 hours. CBG: Recent Labs  Lab 06/13/23 1227 06/13/23 1624 06/13/23 2235 06/14/23 0726  GLUCAP 170* 109* 112* 102*   D-Dimer: No  results for input(s): "DDIMER" in the last 72 hours. Hgb A1c: Recent Labs    06/13/23 0839  HGBA1C 6.1*   Lipid Profile: No results for input(s): "CHOL", "HDL", "LDLCALC", "TRIG", "CHOLHDL", "LDLDIRECT" in the last 72 hours. Thyroid function studies: Recent Labs    06/13/23 0839  TSH 2.293   Anemia work up: Recent Labs    06/13/23 1040 06/13/23  1041  VITAMINB12 186  --   FERRITIN  --  341*  TIBC  --  217*  IRON  --  74   Sepsis Labs: Recent Labs  Lab 07/06/2023 0839 06/14/23 0403  WBC 6.7 6.2    Microbiology No results found for this or any previous visit (from the past 240 hours).  Procedures and diagnostic studies:  EEG adult Result Date: 07/06/2023 Arleene Lack, MD     July 06, 2023  3:50 PM Patient Name: Selig M Bonneau MRN: [id number] Epilepsy Attending: Arleene Lack Referring Physician/Provider: Vita Grip, MD Date: 07-06-2023 Duration: 38.41 mins Patient history: 48yo M with syncope. EEG to evaluate for seizure Level of alertness: Awake, asleep AEDs during EEG study: None Technical aspects: This EEG study was done with scalp electrodes positioned according to the 10-20 International system of electrode placement. Electrical activity was reviewed with band pass filter of 1-70Hz , sensitivity of 7 uV/mm, display speed of 30mm/sec with a 60Hz  notched filter applied as appropriate. EEG data were recorded continuously and digitally stored.  Video monitoring was available and reviewed as appropriate. Description: The posterior dominant rhythm consists of 8-9 Hz activity of moderate voltage (25-35 uV) seen predominantly in posterior head regions, symmetric and reactive to eye opening and eye closing. Sleep was characterized by vertex waves, sleep spindles (12 to 14 Hz), maximal frontocentral region.   Hyperventilation and photic stimulation were not performed.   IMPRESSION: This study is within normal limits. No seizures or epileptiform discharges were seen throughout  the recording. A normal interictal EEG does not exclude the diagnosis of epilepsy. Arleene Lack   DG Chest 2 View Result Date: 07-06-23 CLINICAL DATA:  Shortness of breath and syncope EXAM: CHEST - 2 VIEW COMPARISON:  Chest radiograph dated 05/10/2010 FINDINGS: Lines/tubes: Left chest wall ICD lead projects over the right ventricle. Lungs: Well inflated lungs. No focal consolidation. Pleura: No pneumothorax or pleural effusion. Heart/mediastinum: Similar mildly enlarged cardiomediastinal silhouette. Bones: No acute osseous abnormality. IMPRESSION: 1. No active cardiopulmonary disease. 2. Similar mild cardiomegaly. Electronically Signed   By: Limin  Xu M.D.   On: 07/06/2023 08:47               LOS: 0 days   Lydia Meng  [facility]   Pager on [email or web address]. If 7PM-7AM, please contact night-coverage at [email or web address]     06/14/2023, 11:25 AM

## 2023-06-14 NOTE — Plan of Care (Signed)

## 2023-06-14 NOTE — Plan of Care (Signed)

## 2023-06-14 NOTE — Care Management Obs Status (Signed)
 MEDICARE OBSERVATION STATUS NOTIFICATION   Patient Details  Name: Terry [name] MRN: [id number] Date of Birth: 1975-06-16   Medicare Observation Status Notification Given:  Rudolph Cost, CMA 06/14/2023, 2:02 PM

## 2023-06-15 ENCOUNTER — Observation Stay

## 2023-06-15 DIAGNOSIS — I5032 Chronic diastolic (congestive) heart failure: Secondary | ICD-10-CM

## 2023-06-15 DIAGNOSIS — N185 Chronic kidney disease, stage 5: Secondary | ICD-10-CM

## 2023-06-15 DIAGNOSIS — R55 Syncope and collapse: Secondary | ICD-10-CM | POA: Diagnosis not present

## 2023-06-15 LAB — ECHOCARDIOGRAM COMPLETE
AR max vel: 1.78 cm2
AV Area VTI: 1.85 cm2
AV Area mean vel: 1.79 cm2
AV Mean grad: 4.4 mmHg
AV Peak grad: 9.2 mmHg
Ao pk vel: 1.51 m/s
Area-P 1/2: 5.13 cm2
Calc EF: 54.2 %
Height: 68 in
MV M vel: 5.14 m/s
MV Peak grad: 105.7 mmHg
S' Lateral: 4.75 cm
Single Plane A2C EF: 48.4 %
Single Plane A4C EF: 57.6 %
Weight: 4640 [oz_av]

## 2023-06-15 LAB — GLUCOSE, CAPILLARY
Glucose-Capillary: 93 mg/dL (ref 70–99)
Glucose-Capillary: 156 mg/dL — ABNORMAL HIGH (ref 70–99)

## 2023-06-15 MED ORDER — CYANOCOBALAMIN 1000 MCG PO TABS: 1000.0000 ug | ORAL_TABLET | Freq: Every day | ORAL | 0 refills | Status: AC

## 2023-06-15 MED ORDER — LANTUS SOLOSTAR 100 UNIT/ML ~~LOC~~ SOPN: 8.0000 [IU] | PEN_INJECTOR | Freq: Every day | SUBCUTANEOUS | Status: DC

## 2023-06-15 MED ORDER — TECHNETIUM TO 99M ALBUMIN AGGREGATED
4.0000 | Freq: Once | INTRAVENOUS | Status: AC | PRN
  Administered 2023-06-15: 4.29 via INTRAVENOUS

## 2023-06-15 NOTE — Plan of Care (Signed)
  Problem: Education: Goal: Ability to describe self-care measures that may prevent or decrease complications (Diabetes Survival Skills Education) will improve Outcome: Progressing Goal: Individualized Educational Video(s) Outcome: Progressing   Problem: Fluid Volume: Goal: Ability to maintain a balanced intake and output will improve Outcome: Progressing   Problem: Health Behavior/Discharge Planning: Goal: Ability to identify and utilize available resources and services will improve Outcome: Progressing Goal: Ability to manage health-related needs will improve Outcome: Progressing   Problem: Metabolic: Goal: Ability to maintain appropriate glucose levels will improve Outcome: Progressing   Problem: Nutritional: Goal: Maintenance of adequate nutrition will improve Outcome: Progressing Goal: Progress toward achieving an optimal weight will improve Outcome: Progressing   Problem: Skin Integrity: Goal: Risk for impaired skin integrity will decrease Outcome: Progressing   Problem: Tissue Perfusion: Goal: Adequacy of tissue perfusion will improve Outcome: Progressing   Problem: Education: Goal: Knowledge of General Education information will improve Description: Including pain rating scale, medication(s)/side effects and non-pharmacologic comfort measures Outcome: Progressing   Problem: Health Behavior/Discharge Planning: Goal: Ability to manage health-related needs will improve Outcome: Progressing   Problem: Clinical Measurements: Goal: Ability to maintain clinical measurements within normal limits will improve Outcome: Progressing Goal: Will remain free from infection Outcome: Progressing Goal: Diagnostic test results will improve Outcome: Progressing Goal: Respiratory complications will improve Outcome: Progressing Goal: Cardiovascular complication will be avoided Outcome: Progressing   Problem: Activity: Goal: Risk for activity intolerance will  decrease Outcome: Progressing   Problem: Nutrition: Goal: Adequate nutrition will be maintained Outcome: Progressing   Problem: Coping: Goal: Level of anxiety will decrease Outcome: Progressing   Problem: Elimination: Goal: Will not experience complications related to bowel motility Outcome: Progressing Goal: Will not experience complications related to urinary retention Outcome: Progressing   Problem: Pain Managment: Goal: General experience of comfort will improve and/or be controlled Outcome: Progressing   Problem: Skin Integrity: Goal: Risk for impaired skin integrity will decrease Outcome: Progressing

## 2023-06-15 NOTE — Discharge Summary (Addendum)
 Physician Discharge Summary   Patient: Terry [name] MRN: [id number] DOB: 11-05-1975  Admit date:     06/13/2023  Discharge date: 06/15/23  Discharge Physician: Donaciano Frizzle   PCP: Pcp, No   Recommendations at discharge:   Follow-up with PCP in 1 week. Follow-up with nephrology in 2 weeks.   Discharge Diagnoses: Principal Problem:   Syncope Active Problems:   CKD (chronic kidney disease) stage 5, GFR less than 15 ml/min (HCC)   Coronary artery disease   Chronic diastolic CHF (congestive heart failure) (HCC)   Atrial fibrillation, chronic (HCC)   Type II diabetes mellitus with renal manifestations (HCC)   Elevated troponin   Normocytic anemia  Resolved Problems:   * No resolved hospital problems. *  Hospital Course: Terry [name] is a 48 y.o. male Terry [name] is a 48 y.o. 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 Patient reports he started feeling dizzy and short of breath while going up the stairs to his son's apartment.  Once he entered the room, he sat down on the couch.  When he sat down, he started gasping for air and stop breathing for a moment.  His body tensed and he was just staring.  This episode lasted about a minute and he regained consciousness immediately.  He was completely aware of his surroundings after regaining consciousness.  All this morning, his son was on the phone with 911 and patient was able to answer questions appropriately according to Zahmir, son.  There was no confusion, urinary incontinence or tongue biting.     Syncope: Probably vasovagal syncope.   AICD interrogation unremarkable EEG did not show any epileptiform activity.  2D echo is pending. Pulmonary perfusion does not show any evidence of PE, duplex  ultrasound negative for DVT.  At this point, no additional workup is indicated.  Patient medically stable for discharge.   Mildly elevated troponin: Not consistent with ACS.  No chest pain This may be due to chronic myocardial injury since he has chronically elevated troponins.     Chronic diastolic CHF/nonischemic cardiomyopathy, s/p AICD: Compensated Echocardiogram showed ejection fraction 45 to 50% with moderate pulmonary hypertension.  Will schedule cardiology follow-up.   Paroxysmal atrial fibrillation, history of stroke: Continue Eliquis and amiodarone. History of ablation x 2 in February 2020 for November 2012     Type II DM:  Hemoglobin A1c 8.8 in July 2025 Glucose not elevated in the hospital, reduce the dose.  Follow-up with PCP as outpatient.     CKD stage V Anemia of chronic kidney disease. : Follow-up with nephrologist as an outpatient     Comorbidities include hypertension, anemia of chronic disease, vitamin B12 deficiency, hyperlipidemia, gout         Consultants: None Procedures performed: None  Disposition: Home Diet recommendation:  Discharge Diet Orders (From admission, onward)     Start     Ordered   06/15/23 0000  Diet general       Comments: Renal diet   06/15/23 1535           Renal diet DISCHARGE MEDICATION: Allergies as of 06/15/2023   No Known Allergies      Medication List     STOP taking these medications    hydrochlorothiazide  25 MG tablet Commonly known as: HYDRODIURIL   insulin  aspart 100 unit/mL injection Commonly known as: novoLOG    insulin  NPH Human 100 UNIT/ML injection Commonly known as: NovoLIN N   Trulicity 1.5 MG/0.5ML Soaj Generic drug: Dulaglutide       TAKE these medications    allopurinol 100 MG tablet Commonly known as: ZYLOPRIM Take 50 mg by mouth 2 (two) times a week.   amiodarone 200 MG tablet Commonly known as: PACERONE Take 1 tablet by mouth daily.   apixaban 5 MG Tabs tablet Commonly  known as: ELIQUIS Take 5 mg by mouth 2 (two) times daily.   atorvastatin  40 MG tablet Commonly known as: LIPITOR Take 40 mg by mouth daily.   blood glucose meter kit and supplies Kit Dispense based on patient and insurance preference. Use up to four times daily as directed. (FOR ICD-9 250.00, 250.01).   carvedilol 12.5 MG tablet Commonly known as: COREG Take 12.5 mg by mouth in the morning and at bedtime.   colchicine 0.6 MG tablet Take 0.6 mg by mouth 2 (two) times daily.   cyanocobalamin 1000 MCG tablet Take 1 tablet (1,000 mcg total) by mouth daily. Start taking on: Jun 16, 2023   hydrALAZINE 50 MG tablet Commonly known as: APRESOLINE Take 50 mg by mouth 3 (three) times daily.   insulin  aspart 100 UNIT/ML injection Commonly known as: novoLOG  Inject 5 Units into the skin 3 (three) times daily with meals.   Insulin  Syringe-Needle U-100 25G X 5/8" 1 ML Misc Needs enough insulin  needles & syringes for insulin  aspart 5 units TID w/ meals & insulin  NPH 20 units daily for 30 days   Lantus  SoloStar 100 UNIT/ML Solostar Pen Generic drug: insulin  glargine Inject 8 Units into the skin at bedtime. What changed: how much to take   metolazone 2.5 MG tablet Commonly known as: ZAROXOLYN Take 2.5 mg by mouth 3 (three) times a week.   torsemide 100 MG tablet Commonly known as: DEMADEX Take 1 tablet by mouth daily.        Discharge Exam: Filed Weights   06/13/23 0752  Weight: 131.5 kg   General exam: Appears calm and comfortable  Respiratory system: Clear to auscultation. Respiratory effort normal. Cardiovascular system: S1 & S2 heard, RRR. No JVD, murmurs, rubs, gallops or clicks.  Gastrointestinal system: Abdomen is nondistended, soft and nontender. No organomegaly or masses felt. Normal bowel sounds heard. Central nervous system: Alert and oriented. No focal neurological deficits. Extremities: 1+ leg edema Skin: No rashes, lesions or ulcers Psychiatry: Judgement and  insight appear normal. Mood & affect appropriate.    Condition at discharge: good  The results of significant diagnostics from this hospitalization (including imaging, microbiology, ancillary and laboratory) are listed below for reference.   Imaging Studies: NM Pulmonary Perfusion Result Date: 06/15/2023 CLINICAL DATA:  Pulmonary embolism (PE) suspected, high prob Shortness of breath.  History of defibrillator. EXAM: NUCLEAR MEDICINE PERFUSION LUNG SCAN TECHNIQUE: Perfusion images were obtained in multiple projections after intravenous injection of radiopharmaceutical. No ventilation imaging performed. RADIOPHARMACEUTICALS:  4.29 mCi Tc-58m MAA IV COMPARISON:  Chest radiographs 06/15/2023 and 06/13/2023. Left lower extremity venous Doppler ultrasound 06/15/2023. Nuclear medicine lung scan 04/26/2010. FINDINGS: The pulmonary perfusion is within normal limits, similar to previous study. No segmental perfusion defects are demonstrated to suggest acute pulmonary embolism. IMPRESSION: No evidence of acute pulmonary embolism on perfusion scintigraphy by PISAPED criteria. Electronically Signed   By: Elmon Hagedorn M.D.   On: 06/15/2023 14:38   DG  Chest 2 View Result Date: 06/15/2023 CLINICAL DATA:  Shortness of breath. EXAM: CHEST - 2 VIEW COMPARISON:  06/13/2023 x-ray and older FINDINGS: Normal cardiopericardial silhouette. Left upper chest battery pack with a single lead defibrillator. Central vascular congestion. No consolidation, pneumothorax or effusion. Degenerative changes of the spine on lateral view. IMPRESSION: Defibrillator.  Prominent central vasculature. Electronically Signed   By: Adrianna Horde M.D.   On: 06/15/2023 12:20   US  Venous Img Lower Unilateral Left (DVT) Result Date: 06/15/2023 CLINICAL DATA:  Shortness of breath for 4 days EXAM: Left LOWER EXTREMITY VENOUS DOPPLER ULTRASOUND TECHNIQUE: Gray-scale sonography with graded compression, as well as color Doppler and duplex ultrasound were  performed to evaluate the lower extremity deep venous systems from the level of the common femoral vein and including the common femoral, femoral, profunda femoral, popliteal and calf veins including the posterior tibial, peroneal and gastrocnemius veins when visible. The superficial great saphenous vein was also interrogated. Spectral Doppler was utilized to evaluate flow at rest and with distal augmentation maneuvers in the common femoral, femoral and popliteal veins. COMPARISON:  None Available. FINDINGS: Contralateral Common Femoral Vein: Respiratory phasicity is normal and symmetric with the symptomatic side. No evidence of thrombus. Normal compressibility. Common Femoral Vein: No evidence of thrombus. Normal compressibility, respiratory phasicity and response to augmentation. Saphenofemoral Junction: No evidence of thrombus. Normal compressibility and flow on color Doppler imaging. Profunda Femoral Vein: No evidence of thrombus. Normal compressibility and flow on color Doppler imaging. Femoral Vein: No evidence of thrombus. Normal compressibility, respiratory phasicity and response to augmentation. Popliteal Vein: No evidence of thrombus. Normal compressibility, respiratory phasicity and response to augmentation. Calf Veins: No evidence of thrombus. Normal compressibility and flow on color Doppler imaging. Superficial Great Saphenous Vein: No evidence of thrombus. Normal compressibility. Venous Reflux:  None. Other Findings: Near anechoic round structure seen the popliteal fossa without blood flow on Doppler measuring 2.7 x 2.0 x 2.1 cm and a second focus measuring 3.5 x 1.1 x 1.7 cm. Possibilities include synovial or Baker cyst but there is a differential. IMPRESSION: No evidence of left lower extremity DVT. Two popliteal fossa cystic foci. Again differential includes Baker's or synovial cyst. Please correlate for any known history and further workup as clinically appropriate. Electronically Signed   By:  Adrianna Horde M.D.   On: 06/15/2023 11:59   ECHOCARDIOGRAM COMPLETE Result Date: 06/15/2023    ECHOCARDIOGRAM REPORT   Patient Name:   Terry [name] Date of Exam: 06/14/2023 Medical Rec #:  [id number]        Height:       68.0 in Accession #:    [id number]       Weight:       290.0 lb Date of Birth:  [birth year]        BSA:          2.393 m Patient Age:    48 years         BP:           138/71 mmHg Patient Gender: M                HR:           62 bpm. Exam Location:  [facility] Procedure: 2D Echo, Cardiac Doppler and Color Doppler (Both Spectral and Color            Flow Doppler were utilized during procedure). Indications:     R55 Syncope  History:  Patient has no prior history of Echocardiogram examinations.                  CHF, CAD, Defibrillator; Risk Factors:Diabetes. Palpitations.  Sonographer:     Brigid Canada RDCS Referring Phys:  [id number] Annette Killings AMPONSAH Diagnosing Phys: Veryl Gottron End MD IMPRESSIONS  1. Left ventricular ejection fraction, by estimation, is 45 to 50%. The left ventricle has mildly decreased function. The left ventricle demonstrates global hypokinesis. The left ventricular internal cavity size was severely dilated. Left ventricular diastolic parameters are consistent with Grade III diastolic dysfunction (restrictive). Elevated left atrial pressure.  2. Right ventricular systolic function is normal. The right ventricular size is mildly enlarged. Mildly increased right ventricular wall thickness. There is moderately elevated pulmonary artery systolic pressure.  3. Left atrial size was mildly dilated.  4. Right atrial size was mildly dilated.  5. The mitral valve is normal in structure. Mild to moderate mitral valve regurgitation. No evidence of mitral stenosis.  6. Tricuspid valve regurgitation is mild to moderate.  7. The aortic valve is tricuspid. There is mild thickening of the aortic valve. Aortic valve regurgitation is not visualized. Aortic valve sclerosis is present,  with no evidence of aortic valve stenosis.  8. Aortic dilatation noted. There is borderline dilatation of the aortic root, measuring 40 mm. There is borderline dilatation of the ascending aorta, measuring 36 mm.  9. The inferior vena cava is dilated in size with <50% respiratory variability, suggesting right atrial pressure of 15 mmHg. FINDINGS  Left Ventricle: Left ventricular ejection fraction, by estimation, is 45 to 50%. The left ventricle has mildly decreased function. The left ventricle demonstrates global hypokinesis. The left ventricular internal cavity size was severely dilated. There is borderline left ventricular hypertrophy. Left ventricular diastolic parameters are consistent with Grade III diastolic dysfunction (restrictive). Elevated left atrial pressure. Right Ventricle: The right ventricular size is mildly enlarged. Mildly increased right ventricular wall thickness. Right ventricular systolic function is normal. There is moderately elevated pulmonary artery systolic pressure. The tricuspid regurgitant velocity is 3.27 m/s, and with an assumed right atrial pressure of 15 mmHg, the estimated right ventricular systolic pressure is 57.8 mmHg. Left Atrium: Left atrial size was mildly dilated. Right Atrium: Right atrial size was mildly dilated. Pericardium: There is no evidence of pericardial effusion. Mitral Valve: The mitral valve is normal in structure. Mild to moderate mitral valve regurgitation. No evidence of mitral valve stenosis. Tricuspid Valve: The tricuspid valve is normal in structure. Tricuspid valve regurgitation is mild to moderate. Aortic Valve: The aortic valve is tricuspid. There is mild thickening of the aortic valve. Aortic valve regurgitation is not visualized. Aortic valve sclerosis is present, with no evidence of aortic valve stenosis. Aortic valve mean gradient measures 4.4  mmHg. Aortic valve peak gradient measures 9.2 mmHg. Aortic valve area, by VTI measures 1.85 cm. Pulmonic  Valve: The pulmonic valve was normal in structure. Pulmonic valve regurgitation is trivial. No evidence of pulmonic stenosis. Aorta: Aortic dilatation noted. There is borderline dilatation of the aortic root, measuring 40 mm. There is borderline dilatation of the ascending aorta, measuring 36 mm. Pulmonary Artery: The pulmonary artery is of normal size. Venous: The inferior vena cava is dilated in size with less than 50% respiratory variability, suggesting right atrial pressure of 15 mmHg. IAS/Shunts: No atrial level shunt detected by color flow Doppler. Additional Comments: A device lead is visualized in the right atrium and right ventricle.  LEFT VENTRICLE PLAX 2D LVIDd:  6.50 cm      Diastology LVIDs:         4.75 cm      LV e' medial:    4.24 cm/s LV PW:         1.00 cm      LV E/e' medial:  22.5 LV IVS:        0.90 cm      LV e' lateral:   7.07 cm/s LVOT diam:     2.20 cm      LV E/e' lateral: 13.5 LV SV:         58 LV SV Index:   24 LVOT Area:     3.80 cm  LV Volumes (MOD) LV vol d, MOD A2C: 203.5 ml LV vol d, MOD A4C: 169.3 ml LV vol s, MOD A2C: 105.0 ml LV vol s, MOD A4C: 71.8 ml LV SV MOD A2C:     98.5 ml LV SV MOD A4C:     169.3 ml LV SV MOD BP:      104.2 ml RIGHT VENTRICLE             IVC RV Basal diam:  5.20 cm     IVC diam: 2.40 cm RV S prime:     14.20 cm/s TAPSE (M-mode): 2.1 cm LEFT ATRIUM             Index        RIGHT ATRIUM           Index LA diam:        5.30 cm 2.21 cm/m   RA Area:     22.10 cm LA Vol (A2C):   84.5 ml 35.31 ml/m  RA Volume:   72.50 ml  30.29 ml/m LA Vol (A4C):   80.2 ml 33.51 ml/m LA Biplane Vol: 84.4 ml 35.26 ml/m  AORTIC VALVE AV Area (Vmax):    1.78 cm AV Area (Vmean):   1.79 cm AV Area (VTI):     1.85 cm AV Vmax:           151.26 cm/s AV Vmean:          98.018 cm/s AV VTI:            0.311 m AV Peak Grad:      9.2 mmHg AV Mean Grad:      4.4 mmHg LVOT Vmax:         70.75 cm/s LVOT Vmean:        46.100 cm/s LVOT VTI:          0.152 m LVOT/AV VTI ratio: 0.49   AORTA Ao Root diam: 4.00 cm Ao Asc diam:  3.60 cm MITRAL VALVE               TRICUSPID VALVE MV Area (PHT): 5.13 cm    TR Peak grad:   42.8 mmHg MV Decel Time: 148 msec    TR Vmax:        327.00 cm/s MR Peak grad: 105.7 mmHg MR Mean grad: 73.0 mmHg    SHUNTS MR Vmax:      514.00 cm/s  Systemic VTI:  0.15 m MR Vmean:     408.0 cm/s   Systemic Diam: 2.20 cm MV E velocity: 95.40 cm/s MV A velocity: 37.20 cm/s MV E/A ratio:  2.56 Sammy Crisp MD Electronically signed by Sammy Crisp MD Signature Date/Time: 06/15/2023/7:11:12 AM    Final    EEG adult Result Date: 06/13/2023 Arleene Lack, MD  06/13/2023  3:50 PM Patient Name: Terry [name] MRN: [id number] Epilepsy Attending: Arleene Lack Referring Physician/Provider: Vita Grip, MD Date: 06/13/2023 Duration: 38.41 mins Patient history: 48yo M with syncope. EEG to evaluate for seizure Level of alertness: Awake, asleep AEDs during EEG study: None Technical aspects: This EEG study was done with scalp electrodes positioned according to the 10-20 International system of electrode placement. Electrical activity was reviewed with band pass filter of 1-70Hz , sensitivity of 7 uV/mm, display speed of 60mm/sec with a 60Hz  notched filter applied as appropriate. EEG data were recorded continuously and digitally stored.  Video monitoring was available and reviewed as appropriate. Description: The posterior dominant rhythm consists of 8-9 Hz activity of moderate voltage (25-35 uV) seen predominantly in posterior head regions, symmetric and reactive to eye opening and eye closing. Sleep was characterized by vertex waves, sleep spindles (12 to 14 Hz), maximal frontocentral region.   Hyperventilation and photic stimulation were not performed.   IMPRESSION: This study is within normal limits. No seizures or epileptiform discharges were seen throughout the recording. A normal interictal EEG does not exclude the diagnosis of epilepsy. Arleene Lack   DG  Chest 2 View Result Date: 06/13/2023 CLINICAL DATA:  Shortness of breath and syncope EXAM: CHEST - 2 VIEW COMPARISON:  Chest radiograph dated 05/10/2010 FINDINGS: Lines/tubes: Left chest wall ICD lead projects over the right ventricle. Lungs: Well inflated lungs. No focal consolidation. Pleura: No pneumothorax or pleural effusion. Heart/mediastinum: Similar mildly enlarged cardiomediastinal silhouette. Bones: No acute osseous abnormality. IMPRESSION: 1. No active cardiopulmonary disease. 2. Similar mild cardiomegaly. Electronically Signed   By: Limin  Xu M.D.   On: 06/13/2023 08:47    Microbiology: Results for orders placed or performed during the hospital encounter of 08/29/19  Blood culture (routine x 2)     Status: None   Collection Time: 08/29/19  6:21 AM   Specimen: BLOOD  Result Value Ref Range Status   Specimen Description BLOOD RAC  Final   Special Requests BOTTLES DRAWN AEROBIC AND ANAEROBIC BCAV  Final   Culture   Final    NO GROWTH 5 DAYS Performed at [facility], [address], [place], [place] [address]    Report Status 09/03/2019 FINAL  Final  Blood culture (routine x 2)     Status: None   Collection Time: 08/29/19  6:21 AM   Specimen: BLOOD  Result Value Ref Range Status   Specimen Description BLOOD LAC  Final   Special Requests BOTTLES DRAWN AEROBIC AND ANAEROBIC BCAV  Final   Culture   Final    NO GROWTH 5 DAYS Performed at [facility], [address] [place], [place] [address]    Report Status 09/03/2019 FINAL  Final  SARS Coronavirus 2 by RT PCR (hospital order, performed in [facility] hospital lab) Nasopharyngeal Nasopharyngeal Swab     Status: None   Collection Time: 08/29/19  7:09 AM   Specimen: Nasopharyngeal Swab  Result Value Ref Range Status   SARS Coronavirus 2 NEGATIVE NEGATIVE Final    Comment: (NOTE) SARS-CoV-2 target nucleic acids are NOT DETECTED.  The SARS-CoV-2 RNA is generally detectable in upper and lower respiratory  specimens during the acute phase of infection. The lowest concentration of SARS-CoV-2 viral copies this assay can detect is 250 copies / mL. A negative result does not preclude SARS-CoV-2 infection and should not be used as the sole basis for treatment or other patient management decisions.  A negative result may occur  with improper specimen collection / handling, submission of specimen other than nasopharyngeal swab, presence of viral mutation(s) within the areas targeted by this assay, and inadequate number of viral copies (<250 copies / mL). A negative result must be combined with clinical observations, patient history, and epidemiological information.  Fact Sheet for Patients:   BoilerBrush.com.cy  Fact Sheet for Healthcare Providers: https://pope.com/  This test is not yet approved or  cleared by the [place]  FDA and has been authorized for detection and/or diagnosis of SARS-CoV-2 by FDA under an Emergency Use Authorization (EUA).  This EUA will remain in effect (meaning this test can be used) for the duration of the COVID-19 declaration under Section 564(b)(1) of the Act, 21 U.S.C. section 360bbb-3(b)(1), unless the authorization is terminated or revoked sooner.  Performed at [facility], [address] [place], [place] [address]     Labs: CBC: Recent Labs  Lab 06/13/23 0839 06/14/23 0403  WBC 6.7 6.2  NEUTROABS 5.2  --   HGB 9.6* 9.5*  HCT 30.0* 29.4*  MCV 93.8 91.9  PLT 199 225   Basic Metabolic Panel: Recent Labs  Lab 06/13/23 0839 06/13/23 2146 06/14/23 0403  NA 143 139 139  K 4.8 4.6 4.2  CL 111 111 111  CO2 23 20* 24  GLUCOSE 170* 152* 96  BUN 70* 69* 65*  CREATININE 4.81* 4.51* 4.61*  CALCIUM  8.6* 8.3* 8.4*  MG 2.3 2.3 2.4  PHOS  --   --  3.5   Liver Function Tests: Recent Labs  Lab 06/14/23 0403  ALBUMIN 2.9*   CBG: Recent Labs  Lab 06/14/23 1200 06/14/23 1617  06/14/23 1955 06/15/23 0740 06/15/23 1057  GLUCAP 159* 102* 144* 93 156*    Discharge time spent: greater than 30 minutes.  Signed: Donaciano Frizzle, MD [facility] 06/15/2023

## 2023-08-20 ENCOUNTER — Emergency Department

## 2023-08-20 ENCOUNTER — Other Ambulatory Visit: Payer: Self-pay

## 2023-08-20 ENCOUNTER — Emergency Department
Admission: EM | Admit: 2023-08-20 | Discharge: 2023-08-20 | Disposition: A | Discharge status: Home / Self Care | Attending: Emergency Medicine | Admitting: Emergency Medicine

## 2023-08-20 DIAGNOSIS — R569 Unspecified convulsions: Secondary | ICD-10-CM | POA: Diagnosis present

## 2023-08-20 DIAGNOSIS — Z7901 Long term (current) use of anticoagulants: Secondary | ICD-10-CM | POA: Insufficient documentation

## 2023-08-20 DIAGNOSIS — I2510 Atherosclerotic heart disease of native coronary artery without angina pectoris: Secondary | ICD-10-CM | POA: Insufficient documentation

## 2023-08-20 DIAGNOSIS — I509 Heart failure, unspecified: Secondary | ICD-10-CM | POA: Diagnosis not present

## 2023-08-20 DIAGNOSIS — E1122 Type 2 diabetes mellitus with diabetic chronic kidney disease: Secondary | ICD-10-CM | POA: Insufficient documentation

## 2023-08-20 DIAGNOSIS — N189 Chronic kidney disease, unspecified: Secondary | ICD-10-CM | POA: Diagnosis not present

## 2023-08-20 LAB — COMPREHENSIVE METABOLIC PANEL WITH GFR
ALT: 11 U/L (ref 0–44)
AST: 23 U/L (ref 15–41)
Albumin: 3.2 g/dL — ABNORMAL LOW (ref 3.5–5.0)
Alkaline Phosphatase: 88 U/L (ref 38–126)
Anion gap: 13 (ref 5–15)
BUN: 90 mg/dL — ABNORMAL HIGH (ref 6–20)
CO2: 24 mmol/L (ref 22–32)
Calcium: 8.3 mg/dL — ABNORMAL LOW (ref 8.9–10.3)
Chloride: 105 mmol/L (ref 98–111)
Creatinine, Ser: 5.85 mg/dL — ABNORMAL HIGH (ref 0.61–1.24)
GFR, Estimated: 11 mL/min — ABNORMAL LOW (ref >60)
Glucose, Bld: 190 mg/dL — ABNORMAL HIGH (ref 70–99)
Potassium: 3.9 mmol/L (ref 3.5–5.1)
Sodium: 142 mmol/L (ref 135–145)
Total Bilirubin: 1.1 mg/dL (ref 0.0–1.2)
Total Protein: 7 g/dL (ref 6.5–8.1)

## 2023-08-20 LAB — CBC WITH DIFFERENTIAL/PLATELET
Abs Immature Granulocytes: 0.01 K/uL (ref 0.00–0.07)
Basophils Absolute: 0 K/uL (ref 0.0–0.1)
Basophils Relative: 1 %
Eosinophils Absolute: 0.2 K/uL (ref 0.0–0.5)
Eosinophils Relative: 5 %
HCT: 30.4 % — ABNORMAL LOW (ref 39.0–52.0)
Hemoglobin: 9.8 g/dL — ABNORMAL LOW (ref 13.0–17.0)
Immature Granulocytes: 0 %
Lymphocytes Relative: 19 %
Lymphs Abs: 1 K/uL (ref 0.7–4.0)
MCH: 29.8 pg (ref 26.0–34.0)
MCHC: 32.2 g/dL (ref 30.0–36.0)
MCV: 92.4 fL (ref 80.0–100.0)
Monocytes Absolute: 0.7 K/uL (ref 0.1–1.0)
Monocytes Relative: 14 %
Neutro Abs: 3.3 K/uL (ref 1.7–7.7)
Neutrophils Relative %: 61 %
Platelets: 211 K/uL (ref 150–400)
RBC: 3.29 MIL/uL — ABNORMAL LOW (ref 4.22–5.81)
RDW: 13.4 % (ref 11.5–15.5)
WBC: 5.3 K/uL (ref 4.0–10.5)
nRBC: 0 % (ref 0.0–0.2)

## 2023-08-20 LAB — CK: Total CK: 121 U/L (ref 49–397)

## 2023-08-20 LAB — TROPONIN I (HIGH SENSITIVITY)
Troponin I (High Sensitivity): 40 ng/L — ABNORMAL HIGH (ref <18)
Troponin I (High Sensitivity): 44 ng/L — ABNORMAL HIGH (ref <18)

## 2023-08-20 MED ORDER — ONDANSETRON 4 MG PO TBDP
4.0000 mg | ORAL_TABLET | Freq: Once | ORAL | Status: AC
  Administered 2023-08-20: 4 mg via ORAL
  Filled 2023-08-20: qty 1

## 2023-08-20 MED ORDER — ACETAMINOPHEN 500 MG PO TABS
1000.0000 mg | ORAL_TABLET | Freq: Once | ORAL | Status: AC
  Administered 2023-08-20: 1000 mg via ORAL
  Filled 2023-08-20: qty 2

## 2023-08-20 MED ORDER — LEVETIRACETAM 500 MG PO TABS: 500.0000 mg | ORAL_TABLET | Freq: Two times a day (BID) | ORAL | 0 refills | Status: DC

## 2023-08-20 MED ORDER — ONDANSETRON HCL 4 MG/2ML IJ SOLN
4.0000 mg | Freq: Once | INTRAMUSCULAR | Status: AC
  Administered 2023-08-20: 4 mg via INTRAVENOUS
  Filled 2023-08-20: qty 2

## 2023-08-20 MED ORDER — ONDANSETRON 8 MG PO TBDP: 8.0000 mg | ORAL_TABLET | Freq: Three times a day (TID) | ORAL | 0 refills | Status: DC | PRN

## 2023-08-20 MED ORDER — LEVETIRACETAM (KEPPRA) 500 MG/5 ML ADULT IV PUSH
4500.0000 mg | Freq: Once | INTRAVENOUS | Status: AC
  Administered 2023-08-20: 4500 mg via INTRAVENOUS
  Filled 2023-08-20: qty 45

## 2023-08-20 NOTE — ED Notes (Signed)
 Pt in waiting room and has now began heaving and starting to vomit. Primary RN and MD made aware, pt to come back to room for more meds and wait for family.

## 2023-08-20 NOTE — ED Notes (Signed)
 Patients dialysis port on right upper arm was not swollen. Bruit/Thrill could be felt. ED staff were unaware of placement before children of patient told staff.  Patient and patients family aware and RN explained situation to family, charge RN, and MD.

## 2023-08-20 NOTE — ED Triage Notes (Signed)
 Patient from home. about 30 mins ago, seizure ago. eyes wifde open with body shakes. lasting 3-5 minutres. Hx of 1 seizure in the past about 3 months ago. Family disrobes post ictal state after seizure. When EMS arrived pt was AOX4 and patient had 1x episode of vomiting. Patient states this time feels the same as the last seizure.   CBG: 218 diabetic and has not taken meds this morning

## 2023-08-20 NOTE — ED Notes (Signed)
 Patient able to keep down saltine crackers and ice water. Patient denies N/V at this time. MD Bradler at bedside re-assessing patient. At this time.

## 2023-08-20 NOTE — ED Notes (Signed)
 PO challenge give to patient. Saltine crackers and glass of ice water.

## 2023-08-20 NOTE — ED Notes (Signed)
 Patient placed back in bed with seizure pads in place. Patient stated they felt nauseated as soon as they sat down in the waiting room. MD Jossie stated to give Zofran  and PO challenge soon after.

## 2023-08-20 NOTE — ED Notes (Signed)
 Patient having family member drive him home.

## 2023-08-20 NOTE — ED Provider Notes (Signed)
 [facility] Provider Note   Event Date/Time   First MD Initiated Contact with Patient 08/20/23 [phone]     (approximate) History  Seizures  HPI Terry [name] is a 48 y.o. male with a past medical history of CKD, CAD, heart failure, paroxysmal atrial fibrillation, type 2 diabetes who presents after an episode of seizure-like activity with body shaking lasting approximately 3-5 minutes with an extended postictal period afterwards reported by family.  When EMS arrived patient was alert and oriented x 4 with 1 episode of vomiting.  Patient states he feels similar to when he had a episode of seizure-like activity 2 months prior to arrival.  Patient was not started on any medication as he had a negative EEG during that stay and was told that he may have syncope versus seizures.  Patient denies any head trauma, oral trauma, or bowel/bladder incontinence. ROS: Patient currently denies any vision changes, tinnitus, difficulty speaking, facial droop, sore throat, chest pain, shortness of breath, abdominal pain, nausea/vomiting/diarrhea, dysuria, or weakness/numbness/paresthesias in any extremity   Physical Exam  Triage Vital Signs: ED Triage Vitals  Encounter Vitals Group     BP      Girls Systolic BP Percentile      Girls Diastolic BP Percentile      Boys Systolic BP Percentile      Boys Diastolic BP Percentile      Pulse      Resp      Temp      Temp src      SpO2      Weight      Height      Head Circumference      Peak Flow      Pain Score      Pain Loc      Pain Education      Exclude from Growth Chart    Most recent vital signs: Vitals:   08/20/23 0828 08/20/23 1030  BP: (!) 162/91 (!) 164/88  Pulse: 70 70  Resp: 17 17  Temp: 98.5 F (36.9 C)   SpO2: 100% 100%   General: Awake, oriented x4. CV:  Good peripheral perfusion. Resp:  Normal effort. Abd:  No distention. Other:  Middle-aged obese African-American male resting comfortably in no acute  distress ED Results / Procedures / Treatments  Labs (all labs ordered are listed, but only abnormal results are displayed) Labs Reviewed  COMPREHENSIVE METABOLIC PANEL WITH GFR - Abnormal; Notable for the following components:      Result Value   Glucose, Bld 190 (*)    BUN 90 (*)    Creatinine, Ser 5.85 (*)    Calcium  8.3 (*)    Albumin  3.2 (*)    GFR, Estimated 11 (*)    All other components within normal limits  CBC WITH DIFFERENTIAL/PLATELET - Abnormal; Notable for the following components:   RBC 3.29 (*)    Hemoglobin 9.8 (*)    HCT 30.4 (*)    All other components within normal limits  TROPONIN I (HIGH SENSITIVITY) - Abnormal; Notable for the following components:   Troponin I (High Sensitivity) 40 (*)    All other components within normal limits  TROPONIN I (HIGH SENSITIVITY) - Abnormal; Notable for the following components:   Troponin I (High Sensitivity) 44 (*)    All other components within normal limits  CK  LACTIC ACID, PLASMA  LACTIC ACID, PLASMA   EKG ED ECG REPORT I, Artist MARLA Kerns, the attending  physician, personally viewed and interpreted this ECG. Date: 08/20/2023 EKG Time: 0832 Rate: 67 Rhythm: normal sinus rhythm QRS Axis: normal Intervals: normal ST/T Wave abnormalities: normal Narrative Interpretation: no evidence of acute ischemia RADIOLOGY ED MD interpretation: CT of the head without contrast interpreted by me shows no evidence of acute abnormalities including no intracerebral hemorrhage, obvious masses, or significant edema - All radiology independently interpreted and agree with radiology assessment Official radiology report(s): CT Head Wo Contrast Result Date: 08/20/2023 CLINICAL DATA:  48 year old male status post seizure like activity. Postictal state. EXAM: CT HEAD WITHOUT CONTRAST TECHNIQUE: Contiguous axial images were obtained from the base of the skull through the vertex without intravenous contrast. RADIATION DOSE REDUCTION: This exam  was performed according to the departmental dose-optimization program which includes automated exposure control, adjustment of the mA and/or kV according to patient size and/or use of iterative reconstruction technique. COMPARISON:  Head CT 08/29/2019. FINDINGS: Brain: Cerebral volume is stable and within normal limits. No midline shift, ventriculomegaly, mass effect, evidence of mass lesion, intracranial hemorrhage or evidence of cortically based acute infarction. Small but circumscribed area of hypodensity in the right corona radiata is new, coronal image 38. Other subtle cerebral white matter hypodensity also appears increased. No cortical encephalomalacia identified. Vascular: Calcified atherosclerosis at the skull base. No suspicious intracranial vascular hyperdensity. Skull: Stable and intact.  No acute osseous abnormality identified. Sinuses/Orbits: Visualized paranasal sinuses and mastoids are clear. Other: Calcified scalp vessel atherosclerosis. No acute orbit or scalp soft tissue injury identified. IMPRESSION: 1. New since 2021 but chronic appearing lacunar infarct of the right corona radiata. 2. No acute intracranial abnormality or acute traumatic injury identified. Electronically Signed   By: VEAR Hurst M.D.   On: 08/20/2023 09:20   PROCEDURES: Critical Care performed: No Procedures MEDICATIONS ORDERED IN ED: Medications  levETIRAcetam  (KEPPRA ) undiluted injection 4,500 mg (4,500 mg Intravenous Given 08/20/23 0840)  acetaminophen  (TYLENOL ) tablet 1,000 mg (1,000 mg Oral Given 08/20/23 0845)  ondansetron  (ZOFRAN ) injection 4 mg (4 mg Intravenous Given 08/20/23 0859)   IMPRESSION / MDM / ASSESSMENT AND PLAN / ED COURSE  I reviewed the triage vital signs and the nursing notes.                             The patient is on the cardiac monitor to evaluate for evidence of arrhythmia and/or significant heart rate changes. Patient's presentation is most consistent with acute presentation with potential  threat to life or bodily function. Patient presents after recent seizure episode.  Patient had slow return to baseline mental and physical function per EMS. No immunosuppresion hx and had no preceding fever. No history of alcohol abuse or suspicion for toxin ingestion. Unlikely stroke, syncope. Unlikely infectious etiology. No preceding trauma.  Workup: EKG, BMP, POCT glucose (pregnancy test if male) and CT Brain.  Field Interventions: None ED Interventions: 4.5 g Keppra  IV  Patient given neurology follow-up with Dr. Lane Disposition: Discharge home with primary care follow up in next 24-48 hours.   FINAL CLINICAL IMPRESSION(S) / ED DIAGNOSES   Final diagnoses:  Seizure-like activity (HCC)   Rx / DC Orders   ED Discharge Orders          Ordered    levETIRAcetam  (KEPPRA ) 500 MG tablet  2 times daily        08/20/23 1112           Note:  This document was prepared using Dragon voice  recognition software and may include unintentional dictation errors.   Jossie Artist POUR, MD 08/20/23 1113

## 2023-08-20 NOTE — ED Notes (Addendum)
 Patient denies pain nor nausea at this time before departure. Patient agrees to DC to the lobby to wait for family. Patient agrees to ambulation. Ambulatory on DC.

## 2023-08-20 NOTE — ED Notes (Signed)
 Family member at bedside.

## 2023-09-06 NOTE — Progress Notes (Signed)
 [facility]ures[facility] Note  Primary Cardiologist/Cardiology Provider(s):  Dr Brock, Dr Lorean,  Dr Drusilla PCP:  Pamella Barnie Dragon, MD  Last Cardiology Clinic Visit: 08/26/23 Last IV Diuresis Visit: 03/31/23  Reason for Visit: Terry [name] is a 48 y.o. male with a history of systolic heart failure, who is being seen today in the [facility] for an urgent visit for volume status optimization including IV diuresis.  Assessment/Plan: Acute on Chronic systolic heart failure with nonischemic etiology, EF 45-50% - Volume status today is improving. Weight 328.6 --> 342 --> 329.6 ---> 306.5 --> 294.6 --> 285.6. --> 279.4 --> 298 --> 296.9 --> 286.6. - cardioMEMS PAD is improving, from upper 30s now to low-mid 30s.  - NYHA Class II - IV Lasix  was not administered during clinic - Labs were obtained: Cr 6.18, K 4.5, pro-BNP 9354 (from 15340) - Continue torsemide  80mg  BID and metolazone MWF - Continue Bidil  20-37.5mg  2 tabs TID  2. HTN - BP: 151/84  - not well controlled  - Continue Bidil  20-37.5mg  2 tabs TID - add amlodipine 5mg  daily - not on ace/arb/arni due to CKD  3. Sleep apnea, obesity - scheduled w/ CPP to discuss weight management - GLP1? - scheduled with neurology/sleep clinic  Response to IV diuresis:       Medications Heart rate Blood pressure Weight (lbs)  Pre  59 151/84 286.6  Hour 0 IV Lasix 0 mg     Hour 2 IV Lasix 0 mg     Post       Output: I/O     None       Return to clinic:   No follow-ups on file.  Future Appointments  Date Time Provider Department Center  09/07/2023  9:40 AM Phipps, Barnie Dragon, MD [facility]  09/08/2023  9:50 AM Odelia Madison Ovens, MD [facility]  09/22/2023  1:00 PM Jabier Jubilee, [place], CPP [facility]  10/06/2023  9:30 AM Lennie, Damien Augusta, AGNP [facility]  10/13/2023 10:30 AM Kshirsagar, Verdia Cullen, MD [facility]  10/28/2023  1:10 PM  Carter Evern Player, MD [facility]  11/01/2023  2:00 PM Layman Offer [facility]  11/03/2023  8:00 AM Angelena Pastor, PA [facility]  03/06/2024  9:30 AM Vannie Murtis Nest, ANP [facility]    History of Present Illness: Terry [name] is a 48 y.o. male with a history of systolic heart failure who presents today for follow up of volume status.  He was last seen 08/26/23 by Currie Chang, at which time his torsemide  was increased to 80mg  BID for weight gain.  He was also referred to pharmacist for weight management. Today his weight is down from last visit, so he thinks the increased torsemide  has helped. He is feeling good. He denies edema, bloating, DOE, orthopnea/PND. He is taking torsemide  80 BID and metolazone MWF.   Recent cardioMEMS Taken on PA Systolic PA Diastolic +/- Goal ? PA Mean Heart Rate  09-06-2023 07:53 AM 88 mmHg 33 mmHg -1 56 mmHg 66 bpm  09-05-2023 09:10 AM 87 mmHg 32 mmHg -2 54 mmHg 65 bpm  09-04-2023 09:34 AM 92 mmHg 33 mmHg -1 58 mmHg 72 bpm  09-03-2023 08:57 AM 84 mmHg 31 mmHg -3 51 mmHg 61 bpm  09-02-2023 09:33 AM 93 mmHg 37 mmHg 3 62 mmHg 78 bpm  09-01-2023 08:36 AM 84 mmHg 33 mmHg -1 55 mmHg 71 bpm  08-31-2023 05:56 AM 93 mmHg 37 mmHg 3 60 mmHg 69 bpm  08-30-2023 08:59 AM 91 mmHg 35 mmHg 1 58 mmHg 70 bpm  08-29-2023 09:16 AM 99 mmHg 38 mmHg 4 64 mmHg 71 bpm  08-28-2023 09:43 AM 100 mmHg 37 mmHg 3 63 mmHg 61 bpm  08-27-2023 09:00 AM 102 mmHg 40 mmHg 6 66 mmHg 73 bpm   Past Medical History:  Diagnosis Date  . A-fib       s/p ablation  . AICD (automatic cardioverter/defibrillator) present   . CHF (congestive heart failure)      . CVA (cerebral vascular accident)    02/03/2023  . Diabetes mellitus      . Hypertension   . Morbid obesity (CMS-HCC)    Past Surgical History:  Procedure Laterality Date  . CARDIAC DEFIBRILLATOR PLACEMENT    . PR ANASTOMOSIS,AV,ANY SITE Right 03/10/2023    Procedure: ARTERIOVENOUS ANASTOMOSIS, OPEN; DIRECT, ANY SITE, UPPER EXTREMITY;  Surgeon: Marchelle Kitchens, MD;  Location[facility] OR [facility];  Service: General Surgery  . PR AV ANAST,UP ARM BASILIC VEIN TRANSPOSIT Right 04/28/2023   Procedure: ARTERIOVENOUS ANASTOMOSIS, OPEN; BY UPPER ARM BASILIC VEIN TRANSPOSITION;  Surgeon: Marchelle Kitchens, MD;  Location: [facility] OR [facility];  Service: General Surgery  . PR CATH PLACE/CORON ANGIO, IMG SUPER/INTERP,R&L HRT CATH, L HRT VENTRIC N/A 01/11/2022   Procedure: Left/Right Heart Catheterization;  Surgeon: Eloy Fairy Rigg, MD;  Location: [facility] CATH;  Service: Cardiology  . PR COMPRE EP EVAL ABLTJ ATR FIB PULM VEIN ISOLATION N/A 03/05/2022   Procedure: Afib Ablation;  Surgeon: Gehi, Anil Kishin, MD;  Location: [facility] EP;  Service: Cardiology  . PR CYSTO/URETERO/PYELOSCOPY, CALCULUS TX Left 02/28/2018   Procedure: CYSTOURETHROSCOPY, W/URETEROSCOPY &/OR PYELOSCOPY; W/REMOVAL/MANIPULATION CALCULUS(URETERAL CATH INCLUDED);  Surgeon: Adine Alm Kays, MD;  Location: [facility] OR [facility];  Service: Urology  . PR CYSTOSCOPY,INSERT URETERAL STENT Left 02/07/2018   Procedure: CYSTOURETHROSCOPY,  WITH INSERTION OF INDWELLING URETERAL STENT (EG, GIBBONS OR DOUBLE-J TYPE). Estimated time 20 min.;  Surgeon: Adine Alm Kays, MD;  Location: [facility] OR [facility];  Service: Urology  . PR CYSTOSCOPY,INSERT URETERAL STENT Left 02/28/2018   Procedure: CYSTOURETHROSCOPY,  WITH INSERTION OF INDWELLING URETERAL STENT (EG, GIBBONS OR DOUBLE-J TYPE);  Surgeon: Adine Alm Kays, MD;  Location: [facility] OR [facility];  Service: Urology  . PR RIGHT HEART CATH O2 SATURATION & CARDIAC OUTPUT N/A 12/17/2022   Procedure: Right Heart Catheterization;  Surgeon: Eloy Fairy Rigg, MD;  Location: [facility] Cath;  Service: Cardiology  . PR RMVL IMPLTBL DFB PLSE GEN W/REPL PLSE GEN 1 LEAD N/A 12/13/2019   Procedure: Remove & Replace ICD Single Generator;  Surgeon: Donnice Garnette Lee, MD;   Location: [facility] EP;  Service: Cardiology  . PR TCAT IMPL WRLS P-ART PRS SNR L-T HEMODYN MNTR N/A 07/21/2023   Procedure: Cardiomems with RHC;  Surgeon: Drusilla Elsie Lupita DOUGLAS, MD;  Location: [facility] CATH;  Service: Cardiology  . PR TREATMENT EXTENSIVE RETINOPATHY PHOTOCOAGULATION Left 07/06/2017   Procedure: TREATMENT OF EXTENSIVE OR PROGRESSIVE RETINOPATHY (EG, DIABETIC RETINOPATHY), PHOTOCOAGULATION;  Surgeon: Diona Mana, MD;  [facility]: [facility] OR [facility];  Service: Ophthalmology  . PR VITRECTOMY,PANRETINAL LASER RX Right 07/06/2017   Procedure: VITRECTOMY, MECHANICAL, PARS PLANA APPROACH; WITH ENDOLASER PANRETINAL PHOTOCOAGULATION;  Surgeon: Diona Mana, MD;  Location: [facility] OR [facility];  Service: Ophthalmology    Current Outpatient Medications  Medication Sig Dispense Refill  . allopurinol  (ZYLOPRIM ) 100 MG tablet Take 1/2 tablet (50 mg total) by mouth every Monday,  Wednesday, and Friday. 18 tablet 3  . amiodarone  (PACERONE ) 200 MG tablet Take 1 tablet (200 mg total) by mouth daily. 60 tablet 11  . amlodipine (NORVASC) 5 MG tablet Take 1 tablet (5 mg total) by mouth daily. 30 tablet 11  . apixaban  (ELIQUIS ) 5 mg Tab Take 1 tablet (5 mg total) by mouth two (2) times a day. 60 tablet 6  . atorvastatin  (LIPITOR) 40 MG tablet Take 1 tablet (40 mg total) by mouth daily. For cholesterol 90 tablet 3  . blood-glucose sensor (DEXCOM G7 SENSOR) Devi Use 1 sensor every 10 days 9 each 3  . carvedilol  (COREG ) 25 MG tablet Take 1 tablet (25 mg total) by mouth two (2) times a day. 180 tablet 3  . colchicine (COLCRYS) 0.6 mg tablet Take 1/2 tablet (0.3mg ) by mouth at first sign of gout flare, then 1/2 tablet by mouth every three days until flare resolves. (Patient taking differently: Take 1/2 tablet (0.3mg ) by mouth at first sign of gout flare, then 1/2 tablet by mouth every three days until flare resolves. PRN) 15 tablet 11  . cyanocobalamin , vitamin B-12, (VITAMIN B-12) 1000 MCG tablet Take 1 tablet (1,000  mcg total) by mouth daily. 30 tablet 0  . glucagon spray 3 mg/actuation Spry Use 1 spray in 1 nostril for severe hypoglycemia as directed by physician as per package instructions 2 each 3  . insulin  aspart (NOVOLOG  FLEXPEN) 100 unit/mL (3 mL) injection pen Inject 4 units before each meal. 15 mL 3  . insulin  glargine (BASAGLAR , LANTUS ) 100 unit/mL (3 mL) injection pen Inject 0.15 mL (15 Units total) under the skin nightly. 15 mL 3  . insulin  syringe-needle U-100 1 mL 25 gauge x 5/8 Syrg     . isosorbide -hydrALAZINE  (BIDIL ) 20-37.5 mg per tablet Take 2 tablets by mouth Three (3) times a day. 540 tablet 3  . levETIRAcetam  (KEPPRA ) 500 MG tablet Take 1 tablet (500 mg total) by mouth 2 (two) times daily. 60 tablet 0  . metOLazone (ZAROXOLYN) 2.5 MG tablet Take 1 tablet by mouth as needed up to 3 times a week when instructed by cardiology clinic, supply to last 30 days. 12 tablet 11  . ondansetron  (ZOFRAN -ODT) 8 MG disintegrating tablet Dissolve 1 tablet (8 mg total) by mouth every 8 (eight) hours as needed for nausea or vomiting. 20 tablet 0  . torsemide  (DEMADEX ) 20 MG tablet Take 4 tablets (80 mg total) by mouth two (2) times a day. 720 tablet 0   No current facility-administered medications for this visit.   No Known Allergies  Objective:    Physical Exam BP 151/84   Pulse 59   Wt (!) 130 kg (286 lb 9.6 oz)   BMI 43.58 kg/m   Wt Readings from Last 12 Encounters:  09/06/23 (!) 130 kg (286 lb 9.6 oz)  09/06/23 (!) 130 kg (286 lb 9.6 oz)  08/26/23 (!) 135.8 kg (299 lb 6.4 oz)  07/21/23 (!) 131.5 kg (290 lb)  07/19/23 (!) 136.3 kg (300 lb 6.4 oz)  07/04/23 (!) 135 kg (297 lb 9.6 oz)  05/11/23 (!) 135.2 kg (298 lb)  05/02/23 (!) 128.4 kg (283 lb)  04/28/23 (!) 130 kg (286 lb 11.2 oz)  04/20/23 (!) 132 kg (291 lb)  04/18/23 (!) 132.2 kg (291 lb 6.4 oz)  03/31/23 (!) 134.7 kg (296 lb 14.4 oz)   General:  Patient is well-appearing in no acute distress  Eyes:  Intact, sclerae anicteric.   Ears, nose, mouth: Benign  Respiratory:   Normal respiratory effort. Clear to auscultation bilaterally.  There are no wheezes.  Cardiovascular:  JVP not seen above clavicle w/  HOB at 90degrees.  Rate and rhythm are regular.  There is no lifts or heaves.  Normal S1, S2. There is no murmur, gallops or rubs.  Radial and pedal pulses are 2+, bilaterally.   There is trace pedal/pretibial edema, bilaterally.   Gastrointestinal:   Soft, non-tender, with audible bowel sounds. Abdomen nondistended.  Liver is nonpalpable.  Musculoskeletal: No joint swelling  Skin: Warm, well perfused.  Neurologic: Appropriate mood and affect. Alert and oriented to person, place, and time. No gross motor or sensory deficits evident.   Recent Labs: Office Visit on 09/06/2023  Component Date Value Ref Range Status  . Sodium 09/06/2023 142  135 - 145 mmol/L Final  . Potassium 09/06/2023 4.5  3.4 - 4.8 mmol/L Final  . Chloride 09/06/2023 105  98 - 107 mmol/L Final  . CO2 09/06/2023 25.8  20.0 - 31.0 mmol/L Final  . Anion Gap 09/06/2023 11  5 - 14 mmol/L Final  . BUN 09/06/2023 98 (H)  9 - 23 mg/dL Final  . Creatinine 91/94/7974 6.18 (H)  0.73 - 1.18 mg/dL Final  . BUN/Creatinine Ratio 09/06/2023 16   Final  . eGFR CKD-EPI (2021) Male 09/06/2023 10 (L)  >=60 mL/min/1.37m2 Final  . Glucose 09/06/2023 136  70 - 179 mg/dL Final  . Calcium  09/06/2023 8.7  8.7 - 10.4 mg/dL Final  . Albumin  09/06/2023 3.2 (L)  3.4 - 5.0 g/dL Final  . Total Protein 09/06/2023 7.5  5.7 - 8.2 g/dL Final  . Total Bilirubin 09/06/2023 0.5  0.3 - 1.2 mg/dL Final  . AST 91/94/7974 13  <=34 U/L Final  . ALT 09/06/2023 10  10 - 49 U/L Final  . Alkaline Phosphatase 09/06/2023 104  46 - 116 U/L Final  . Magnesium 09/06/2023 2.7 (H)  1.6 - 2.6 mg/dL Final  . PRO-BNP 91/94/7974 9,354.0 (H)  <=300.0 pg/mL Final  Clinical Support on 09/06/2023  Component Date Value Ref Range Status  . EKG Ventricular Rate 09/06/2023 56  BPM Preliminary  . EKG Atrial  Rate 09/06/2023 56  BPM Preliminary  . EKG P-R Interval 09/06/2023 158  ms Preliminary  . EKG QRS Duration 09/06/2023 94  ms Preliminary  . EKG Q-T Interval 09/06/2023 472  ms Preliminary  . EKG QTC Calculation 09/06/2023 455  ms Preliminary  . EKG Calculated P Axis 09/06/2023 65  degrees Preliminary  . EKG Calculated R Axis 09/06/2023 70  degrees Preliminary  . EKG Calculated T Axis 09/06/2023 234  degrees Preliminary  . QTC Fredericia 09/06/2023 461  ms Preliminary    Lab Results  Component Value Date   PRO-BNP 9,354.0 (H) 09/06/2023   PRO-BNP 15,340.0 (H) 08/26/2023   PRO-BNP 27,178.0 (H) 07/04/2023   PRO-BNP 8,564.0 (H) 03/31/2023   PRO-BNP 12,572.0 (H) 03/22/2023   PRO-BNP 561 (H) 07/12/2012   PRO-BNP 6,280 (H) 12/06/2010   PRO-BNP 6,940 (H) 09/28/2010   PRO-BNP 6,980 (H) 08/13/2010   PRO-BNP 7,730 (H) 08/11/2010   Creatinine/FP 0.84 11/29/2011   Creatinine/FP 1.41 (H) 03/24/2011   Creatinine/FP 1.29 02/10/2011   Creatinine/FP 1.17 01/11/2011   Creatinine/FP 1.77 (H) 09/01/2010   Creatinine 6.18 (H) 09/06/2023   Creatinine 5.32 (H) 08/26/2023   Creatinine 5.25 (H) 07/04/2023   Creatinine 4.40 (H) 04/18/2023   Creatinine 5.00 (H) 03/31/2023   Creatinine 0.93 07/13/2012   Creatinine 0.90 07/12/2012   Creatinine 0.85  10/13/2011   Creatinine 1.60 (H) 12/09/2010   Creatinine 1.58 (H) 12/08/2010   BUN/FP 12 11/29/2011   BUN/FP 20 03/24/2011   BUN/FP 20 02/10/2011   BUN/FP 18 01/11/2011   BUN/FP 25 (H) 09/01/2010   BUN 98 (H) 09/06/2023   BUN 69 (H) 08/26/2023   BUN 70 (H) 07/04/2023   BUN 68 (H) 04/18/2023   BUN 74 (H) 03/31/2023   BUN 13 07/13/2012   BUN 14 07/12/2012   BUN 9 10/13/2011   BUN 24 (H) 12/09/2010   BUN 23 (H) 12/08/2010   Sodium 142 09/06/2023   Sodium 142 08/26/2023   Sodium 144 07/04/2023   Sodium 141 04/18/2023   Sodium 145 03/31/2023   Sodium 138 07/13/2012   Sodium 128 (L) 07/12/2012   Sodium 136 10/13/2011   Sodium 141 12/09/2010    Sodium 143 12/08/2010   Potassium/FP 4.3 11/29/2011   Potassium/FP 4.6 03/24/2011   Potassium/FP 4.1 02/10/2011   Potassium/FP 3.9 01/11/2011   Potassium/FP 4.0 09/01/2010   Potassium 4.5 09/06/2023   Potassium 5.2 (H) 08/26/2023   Potassium 4.5 07/04/2023   Potassium 4.5 04/18/2023   Potassium 4.6 03/31/2023   Potassium 3.4 (L) 07/13/2012   Potassium 4.1 07/12/2012   Potassium 4.0 10/13/2011   Potassium 4.7 12/09/2010   Potassium 4.1 12/08/2010   Magnesium 2.7 (H) 09/06/2023   Magnesium 2.3 07/04/2023   Magnesium 2.4 03/31/2023   Magnesium 2.3 03/22/2023   Magnesium 2.3 02/05/2023   Magnesium 1.9 12/09/2010   Magnesium 1.9 12/08/2010   Magnesium 1.9 12/07/2010   Magnesium 2.0 12/07/2010   Magnesium 1.9 12/06/2010    Metric Tracker: Did today's visit result in ED visit? No Did today's visit result in hospital admission? No Did today's visit result in referral to cardiology? n/a Today's visit was a referral from [facility]

## 2023-09-19 NOTE — ED Provider Notes (Addendum)
 [facility] Emergency Department Provider Note   HPI   Terry [name] is a very pleasant 48 y.o. male with a PMH of CVA, seizures, MI, CKD stage V who presents to [facility] ED following a 24-48 hour onset of right-sided sharp, stabbing neck pain that radiates down the length of the right arm, worsens with movement, and is associated with some weakness of the right hand. He shares that he noted the pain after waking up yesterday and felt a severe pain. He tried to rest the arm, but it did not alleviate the issue. He shares that he has had seizure onset before in the past with similar symptoms, but he has not had a seizure since June. He did not have any recent trauma to the area. He denies any fevers, chills, palpitations, dizziness, headaches, nausea, vomiting, or any other symptoms. His family did also want him to come in for evaluation given his history of a stroke and concerns for recurrence.      MDM   BP 159/89   Pulse 74   Temp 36.9 C (98.4 F) (Oral)   Resp 18   SpO2 97%    Hemodynamically stable. On exam, patient is resting comfortably in bed. The patient's exam shows evidence of tenderness on palpation of the neck and along the length of the arm, however, the patient remains neurovascularly intact. Initial differentials included carotid dissection, nerve impingement, shoulder trauma, and cervical radiculopathy.   Given the findings on exam, which demonstrated intact median, ulnar, intraosseus, radial nerves, unremarkable cranial nerve exam, and lack of recent trauma, MRI imaging was deferred at this time and carotid dissection thought to be significantly less likely. Additionally, the nature of the pain and duration were most consistent with a cervical radiculopathy. We did discuss with the patient that the best modality for diagnosing this problem would be an MRI to which he was open to receiving imaging in the outpatient setting. Given overall clinical stability and classic  findings, we did discuss discharge to which the patient was agreeable. For pain control, given severity of CKD, we did discuss the use of a short course of oxycodone  5mg  at home to be used as a supplement if Tylenol  was not working. Patient verbalized understanding. No indication for emergent imaging as no red flags (fever, weakness, trauma)  Disposition: patient discharged home. Likely diagnosis and exam findings discussed with patient and family. Recommended follow-up with PCP and neurosurgery as he is an established patient. Patient in agreement.   No orders of the defined types were placed in this encounter.      The case was discussed with the attending physician who is in agreement with the above assessment and plan.  - Any discussion of this patient's case/presentation between myself and consultants, admitting teams, or other team members has been documented above. - Imaging and other studies: N/A - External records reviewed: N/A - Consideration of admission, observation, transfer, or escalation of care: N/A   ED Clinical Impression   48 year old male with CVA, CKD V, seizures presenting for right neck and shoulder pain. Does not appear in active distress and clinical history most consistent with cervical radiculopathy. Low concern for immediate intervention.  Final Diagnosis: probable cervical radiculopathy  Physical Exam   Vitals:   09/19/23 0820  BP: 159/89  Pulse: 74  Resp: 18  Temp: 36.9 C (98.4 F)  TempSrc: Oral  SpO2: 97%     Constitutional: In no acute distress. Eyes: Conjunctivae are normal. EOMI.  HEENT:  Normocephalic and atraumatic. Mucous membranes are moist.  Neck: Full active range of motion Cardiovascular: RRR, no m/r/g Respiratory: CTAB, no w/r/r. Speaking easily in full sentences Gastrointestinal: Soft, non-distended, non-tender.  No guarding. Musculoskeletal: No long bone deformities.  Neurologic: Normal speech and language. Right arm  neurovascularly intact: radial, ulnar, median, and central nerve intact with full RoM and 5/5 strength. Grip strength: 5/5, wrist flexion/extension strength: 5/5, shoulder full RoM on passive, limited active secondary to pain. Biceps/triceps: 5/5 strength and full RoM. CN II-XII exam unremarkable.  Skin: Skin is warm, dry and intact. Psychiatric: Mood and affect are normal.   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  Current Outpatient Medications:  .  allopurinol  (ZYLOPRIM ) 100 MG tablet, Take 1/2 tablet (50 mg total) by mouth every Monday, Wednesday, and Friday., Disp: 18 tablet, Rfl: 3 .  amiodarone  (PACERONE ) 200 MG tablet, Take 1 tablet (200 mg total) by mouth daily., Disp: 60 tablet, Rfl: 11 .  amlodipine (NORVASC) 5 MG tablet, Take 1 tablet (5 mg total) by mouth daily., Disp: 30 tablet, Rfl: 11 .  apixaban  (ELIQUIS ) 5 mg Tab, Take 1 tablet (5 mg total) by mouth two (2) times a day., Disp: 60 tablet, Rfl: 6 .  atorvastatin  (LIPITOR) 40 MG tablet, Take 1 tablet (40 mg total) by mouth daily. For cholesterol, Disp: 90 tablet, Rfl: 3 .  blood-glucose sensor (DEXCOM G7 SENSOR) Devi, Use 1 sensor every 10 days, Disp: 9 each, Rfl: 3 .  carvedilol  (COREG ) 25 MG tablet, Take 1 tablet (25 mg total) by mouth two (2) times a day., Disp: 180 tablet, Rfl: 3 .  colchicine (COLCRYS) 0.6 mg tablet, Take 1/2 tablet (0.3mg ) by mouth at first sign of gout flare, then 1/2 tablet by mouth every three days until flare resolves. (Patient taking differently: Take 1/2 tablet (0.3mg ) by mouth at first sign of gout flare, then 1/2 tablet by mouth every three days until flare resolves. PRN), Disp: 15 tablet, Rfl: 11 .  cyanocobalamin , vitamin B-12, (VITAMIN B-12) 1000 MCG tablet, Take 1 tablet (1,000 mcg total) by mouth daily.,  Disp: 30 tablet, Rfl: 0 .  glucagon spray 3 mg/actuation Spry, Use 1 spray in 1 nostril for severe hypoglycemia as directed by physician as per package instructions, Disp: 2 each, Rfl: 3 .  insulin  aspart (NOVOLOG  FLEXPEN) 100 unit/mL (3 mL) injection pen, Inject 4 units before each meal., Disp: 15 mL, Rfl: 3 .  insulin  glargine (BASAGLAR , LANTUS ) 100 unit/mL (3 mL) injection pen, Inject 0.15 mL (15 Units total) under the skin nightly., Disp: 15 mL, Rfl: 3 .  insulin  syringe-needle U-100 1 mL 25 gauge x 5/8 Syrg, , Disp: , Rfl:  .  isosorbide -hydrALAZINE  (BIDIL ) 20-37.5 mg per tablet, Take 2 tablets by mouth Three (3) times a day., Disp: 540 tablet, Rfl: 3 .  levETIRAcetam  (KEPPRA ) 500 MG tablet, Take 1 tablet (500 mg total) by mouth 2 (two) times daily., Disp: 180 tablet, Rfl: 1 .  metOLazone (ZAROXOLYN) 2.5 MG tablet, Take 1 tablet by mouth as needed up to 3 times a week when instructed by cardiology clinic, supply to last 30 days., Disp: 12 tablet, Rfl: 11 .  ondansetron  (ZOFRAN -ODT) 8 MG disintegrating tablet, Dissolve 1 tablet (8 mg total) by mouth every 8 (eight) hours as needed for nausea or vomiting., Disp: 20 tablet, Rfl: 0 .  oxyCODONE  (ROXICODONE ) 5 MG immediate release tablet, Take 1 tablet (5 mg total) by mouth every eight (8) hours as needed., Disp: 12 tablet, Rfl: 0 .  torsemide  (DEMADEX ) 20 MG tablet, Take 4 tablets (80 mg total) by mouth two (2) times a day., Disp: 720 tablet, Rfl: 0  ALLERGIES:  Patient has no known allergies.  SOCIAL HISTORY:  Social History   Tobacco Use  . Smoking status: Never    Passive exposure: Never  . Smokeless tobacco: Never  Substance Use Topics  . Alcohol use: No     Radiology   No orders to display     Laboratory Data  None performed at this time.     I evaluated this patient with Camellia CHRISTINE. I attest that I have reviewed the student note and that the components of the history of the present illness, the physical exam, and the  assessment and plan documented were performed by me or were performed in my presence by the student where I verified the documentation and performed (or re-performed) the exam and medical decision making.   Georgeanna Levon Jansky, MD 09/20/23 1116       [1] Past Medical History: Diagnosis Date  . A-fib        s/p ablation  . AICD (automatic cardioverter/defibrillator) present   . CHF (congestive heart failure)       . CVA (cerebral vascular accident)     02/03/2023  . Diabetes mellitus       . Hypertension   . Morbid obesity (CMS-HCC)    Georgeanna Levon Jansky, MD 09/20/23 1116

## 2023-10-02 NOTE — ED Provider Notes (Signed)
 Received sign out from previous provider.  Patient Summary: Terry [name] is a 48 y.o. male with a past medical history of CVA, MI, CKD stage V, and seizures who presented for 1 day of worsening left knee pain and swelling which has made it very difficult for him to ambulate. Initial vital signs were unremarkable. On exam, the patient was in no acute distress. Left knee effusion with mild enlargement compared to right. Limited range of motion. On workup obtained thus far, CBC and CMP at baseline for patient. CRP elevated to 131.7. PT-INR remarkable for PT elevated to 17.3. Sed rate elevated to 88. XR left knee remarkable for moderate knee effusion effusion with no acute fractures or traumatic malalignment; advanced medial compartment degenerative changes; subchondral lucency of the medial tibial plateau may be related to a developing subchondral lesion. Action List:  Pending body fluid analysis.  Updates ED Course as of 10/02/23 0423  [place] Oct 02, 2023  0209 Does have crystals, does have nucleated cells and RBCs.  No leukocytosis.  No fevers.  On my exam, he is able to range the knee but does have tenderness when doing so.  When I mention the crystals and my concern for possible gout from the fluid collected from his knee, he states that this does feel like prior gout flares.  Will discharge and recommended continuing his allopurinol  and taking colchicine.  He has refills of this at the pharmacy already.  Discussed that a culture was sent off.  If this grows bacteria, that he will receive a phone call.  Told him however if the pain significantly worsens, he has fevers, unable to bend the knee at all, or any other new or worsening symptoms to present to the ED.    Documentation assistance was provided by Coley Emms, Scribe on October 02, 2023 at 1:53 AM for Schuyler Critchley, MD  A scribe was used when documenting this visit. I agree with the above documentation. Signed by  Schuyler SHAUNNA Critchley, MD on  October 02, 2023 at 4:23 AM   Note has been documented by Coley Emms on 10/02/2023

## 2023-10-02 NOTE — ED Notes (Signed)
 [facility] Emergency Department Attestation Note     ED Clinical Impression    Final diagnoses:  Acute pain of left knee (Primary)  Gout, unspecified cause, unspecified chronicity, unspecified site      ED Attending Physician Teaching Attestation    I supervised care provided by the resident. We have discussed the case, I have reviewed the note and I agree with the plan of treatment except as documented in my note.  I have personally performed a face-to-face diagnostic evaluation on this patient.  Procedure performed (joint aspiration). I was present during the key portion of the procedure.       ED Attending Note    ED Triage Vitals [10/01/23 2047]  Enc Vitals Group     BP 156/81     Pulse 71     SpO2 Pulse      Resp 19     Temp 36.8 C (98.3 F)     Temp Source Temporal     SpO2 96 %     Weight (!) 127 kg (280 lb)     Height      Head Circumference      Peak Flow      Pain Score      Pain Loc      Pain Education      Exclude from Growth Chart      Terry [name] is a 48 y.o. male with PMH of CVA, seizures, MI, CKD stage V presenting for L knee pain and swelling that began yesterday spontaneously and worsened overnight to the point that he is unable to ambulate;     Impression, Differential Diagnosis and Plan of Care  Gout vs. Septic arthritis vs effusion vs. Hemarthrosis  Plan for tap, labs, imaging and pain control.   Additional Progress Notes  Update: Elevated inflammatory markers. Joint aspiration completed and fluid sent.   11PM: Pass off given to Dr. Lonni who will assume care of patient while he remains in the emergency department.   See chart and resident provider documentation for details.  Portions of this record have been created using Scientist, clinical (histocompatibility and immunogenetics). Dictation errors have been sought, but may not have been identified and corrected.    Additional Medical Decision Making   This provider entered the patient's room:  Yes:  If this provider did not enter the room, a comprehensive physical exam was not able to be performed due to increased infection risk to themselves, other providers, staff and other patients), as well as to conserve personal protective equipment (PPE) utilization during the COVID-19 pandemic.  If this provider did enter the patient room, the following was PPE worn: N/A  I have reviewed the patient's vital signs and the nursing notes. Any pertinent labs & imaging results which were available during my care of the patient were reviewed by me.   Terry [name] was evaluated in Emergency Department at the time of this visit for the symptoms described in the history of present illness. He was evaluated in the context of the global COVID-19 pandemic, which necessitated consideration that the patient might be at risk for infection with the SARS-CoV-2 virus that causes COVID-19. Institutional protocols and algorithms that pertain to the evaluation of patients at risk for COVID-19 were followed during the patient's care in the ED.

## 2023-11-30 NOTE — H&P (Signed)
 ------------------------------------------------------------------------------- Attestation signed by [name] Terry Ruth, MD at 12/01/23 [phone] Attestation signed by Terry Darral, MD on 12/01/2022  Attending Attestation I discussed this patient with Terry Rockers, NP on the date of the admission and provided input on key aspects of diagnosis and management.  I have reviewed the history and plan as noted and I agree with the findings and plan.  I saw and evaluated the patient on the subsequent day of admission.   Of note, pt warrants inpatient admission, as we will consider using various activating procedures to provoke typical seizures including medication wean/ cessation, sleep deprivation, hyperventilation, photic stimulation, and exercise. Strict seizure precautions and fall precautions with frequent vital sign assessments, nursing care, and neuro checks will be used as these provoking features can put the patient at risk of worsening and more severe seizures such as status epilepticus. This type of test can only be safely performed in the inpatient setting due to the above safety concerns.   Terry Darral, MD.   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 male with a past medical history of 2 events possible  for epilepsy  who presents today for diagnostic evaluation to determine whether their typical events have an electrographic correlate.   Patient was referred to EMU by Dr. Myra.    Abrupt cessation of anti-epileptic drugs and use of various activating procedures to capture a few typical seizures will be utilized. With these activating procedures, patient could be at risk of worsenings seizures such as status epilepticus. Thus, strict seizure preceaution and frequent neuro/VS checks are warranted as an inpatient evaluation.  Home medications: levetiracetam  750mg  BID - Admit to EMU and Initiate video-EEG monitoring for the further characterization of the seizure and seizure like activity and diagnosis of specific type and localization of seizure. - Patient was made aware that he/ she may have to stay in the EMU for up to 5 days given that his/ her event only occurs about once a week. He is informed that our goal is to capture 3 typical events for diagnostic confirmation. We will use activating procedures such as sleep deprivation, photic stimulation and hyperventilation and bedside bike pedaling. In addition, home anti-seizure medications will be reduced or stopped which may provoked more intense and frequent seizures compare to typical seizures experiencing at home.  - For Anti-seizure medication management: continue levetiracetam  - For other activating procedures no provoking procedures - Admission labs: CBC , CMP, Urine toxicology screen, anti-seizure medication levels pending   Rescue Plan: 10 mg diazepam IV for confirmed seizures.   Confirmation for EEG correlate prior to treatment for convulsion,  seizure lasting more than 5 minutes OR 3 or more of any seizures within 4 hours.  Page Dr.Waters for help with EEG confirmation or management.  Notify EMU team (day time) or resident on call (when EMU team is  off).  If IV access is lost, give Diazepam 10mg  IM   EMU Protocol -Initiate continuous video EEG  monitoring  -Initiate seizure precautions including padded siderails, cardiac telemetry, falls precautions, bed alarm, out of bed with assistance only, nursing seizure testing, and frequent vital signs and neuro check. -Insert and maintain a peripheral IV - In case of seizure, please follow EMU seizure protocol   Following chronic stable medical conditions monitoring during this admission:  Diabetes-glargine insulin , sliding scale CHF-torsemide , isosorbide -hydralazine , amiodarone  Obesity- Mounjaro every Mondays.  HTN- amlodipine Gout- allopurinol , colchicine Atrial fibrillation - apixaban  Cholesterolemia- atovastatin   FEN/GI - replete lytes prn  - regular diet   PPx - DVT: Lovenox 40 mg Daily  - GI: no indication for PPI at this time   Access: PIV  Code Status: full code. Confirmed on Admission.  Contact Information PCP: Terry Barnie Dragon, MD Primary Contact: Terry, [name] (Brother) [phone] (Home Phone)   DISPO: EMU, Neurology floor status  Pending further characterization of seizure activity  Discharge Planning:  - Case management: request assistance with discharge  - Social work: N/A - PT: N/A - OT: N/A - SLP: not applicable - PM&R: N/A - Expected Discharge Disposition: TBD.  This patient was seen and discussed with Dr. Darral who agrees with the above assessment and plan.      I directly provided 120  minutes of acute care time as documented in this note. Time includes: direct patient care, patient reassessment, coordination of patient care, interpretation of data, review of patient medical records, patient education, counseling and documentation of patient care. This time is exclusive of separately billable procedures.   Terry Rockers, FNP [facility] EMU  [phone]         HPI     Chief Complaint: Seizure  HPI: Terry [name] is a 48 y.o. old right handed male  has a past medical history of A-fib (CMS-HCC), AICD (automatic  cardioverter/defibrillator) present, CHF (congestive heart failure) (CMS-HCC), CVA (cerebral vascular accident)    (CMS-HCC) (02/03/2023), Diabetes mellitus (CMS-HCC), Hypertension, and Morbid obesity (CMS-HCC)., who is being admitted to the Epilepsy Monitoring Unit today for the further evaluation of seizures/spells. The patient is not accompanied by family who contributes to the history. Patient has 1 types of seizure/seizure like events over the last 1 year. The patient reports to have unchanged seizure control. The seizure frequency is twice .    In late 08/20/23 he was visiting his son, sat down on the couch and felt his right hand go numb, the LOC with eyes rolling and gasping for air, after a minute he came back to and was throwing up and felt very tired afterward. Says he was unconscious but could still hear people talking. Second event in early August, was going outside to mow the grass and feeling SOB, he sat down and remember his right had felt numb, and then had LOC, lasted for a minute and then felt tired after.  Events occurred in the morning. 7:30 am    Seizure Onset Age: 34 Seizure Triggers/ Provoking Features: none identified -any significant life stress present / changes in stressful events?  -The patient is currently not driving  Previous Anti-seizure Drugs tried: none  Epilepsy Risk Factors:   He was the product of an uncomplicated pregnancy and delivery.  The patient had a normal development.There is no history febrile seizure as an infant or child, meningitis, encephalitis ,or significant head trauma.  There is no family history of seizures or epilepsy.  Son has 3 seizures in one day 2 weeks before patient's first event.  He had a stroke in Jan 2025.   Complications of seizures:  Admission for status epilepticus: no Admission for frequent seizures: no Injuries during seizures or attributed to seizures: no   Previous workup:  EEG :09/15/2023 at [facility]- normal  MRI of  brain:02/04/2023-small acute infarct on Medial right central sulcus.  Remote lucanar infarct left external capsule.  Small remote hemorrhage in left posterior parietal lobe.   Allergies[1]   Current Medications[2]  Past Medical History[3]  Past Surgical History[4]  Social History[5]  Family History[6]  Code Status: Full Code   Review of Systems   A 10-system review of systems was conducted and was negative except as documented above in the HPI. Constitutional: Denies fever, or significant change in weight.  Denies difficulty sleeping or excessive daytime sleepiness.  HEENT: Denies hearing problems or sinus problems.   Ophthalmologic:  Denies vision problems.   Cardiovascular: Denies palpitation, chest pain, or shortness of breath. Pulmonary:  Denies cough, hemoptysis or asthma.  GI:  Denies abdominal pain, nausea, vomiting, or diarrhea.  GU:  Denies difficulty voiding.  Endocrine:  Denies history of diabetes or thyroid  problems.  Musculoskeletal: Denies joint or back pain.  Psychiatric:  Denies anxiety or depression.   Skin:  Denies significant rashes or lesions.   Hematologic:  Denies bleeding problems      Objective        No intake/output data recorded.  Physical Exam: General Appearance:Well appearing. In no acute distress. HEENT: Head is atraumatic and normocephalic. Sclera anicteric without injection. Oropharyngeal membranes are moist with no erythema or exudate. Neck: Supple. Lungs: Normal work of breathing. Clear to auscultation in anterior fields. No wheezes or crackles. Heart: Regular rate and rhythm. No murmurs, rubs, or gallops. Abdomen: Soft, nontender, nondistended. Extremities: No clubbing, cyanosis, or edema. Psych: Appropriate affect and behavior Skin: No rash, lesions, or breakdown.   Neurological Examination:    Mental Status: Alert, conversant, able to follow conversation and interview. Spontaneous speech was fluent without word finding pauses,  dysarthria, or paraphasic errors. Comprehension was intact. Memory for recent and remote events was intact. Memory: 3/3 registration, 3/3 recall at 3 minutes. Language was clear and fluent with intact naming, repetition, good fund of knowledge of current events, and good concentration.  Cranial Nerves: PERRL. Pursuit eye movements were uninterrupted with full range and without more than end-gaze nystagmus. Facial sensation intact bilaterally to light touch in all three divisions of CNV. Face symmetric at rest. Normal facial movement bilaterally, including forehead, eye closure and grimace/smile. Hearing intact to conversation. Shoulder shrug full strength bilaterally. Palate movement is symmetric. Tongue protrudes midline and tongue movements are normal.   Motor Exam: Normal bulk. No tremors, myoclonus, or other adventitious movement. Pronator drift is absent. RUE: 5/5 grossly throughout. LUE: 5/5 grossly throughout. RLE: 5/5 grossly throughout. LLE: 5/5 grossly throughout.   Reflexes: DTRs are 2+ and symmetric throughout. Toes are downgoing bilaterally.  Sensory: Grossly intact to light touch in all extremities.     Cerebellar/Coordination/Gait: Finger-to-nose is normal without ataxia or dysmetria bilaterally. Heel-to-shin is normal without ataxia or dysmetria bilaterally. Gait exam demonstrates normal posture, base, stride length, arm swing and turns.     Physical exam was performed by Gildardo Budds M.D.     Diagnostic Studies   All Labs Last 24hrs: No results found for this or any previous visit (from the past 24 hours).        [  1] No Known Allergies [2] No current facility-administered medications for this encounter.   Current Outpatient Medications  Medication Sig Dispense Refill  . allopurinol  (ZYLOPRIM ) 100 MG tablet Take 1/2 tablet (50 mg total) by mouth every Monday, Wednesday, and Friday. 18 tablet 3  . amiodarone  (PACERONE ) 200 MG tablet Take 1 tablet (200 mg total) by  mouth daily. 60 tablet 11  . amlodipine (NORVASC) 5 MG tablet Take 1 tablet (5 mg total) by mouth daily. 30 tablet 11  . apixaban  (ELIQUIS ) 5 mg Tab Take 1 tablet (5 mg total) by mouth two (2) times a day. 60 tablet 6  . atorvastatin  (LIPITOR) 40 MG tablet Take 1 tablet (40 mg total) by mouth daily. For cholesterol 90 tablet 3  . blood-glucose sensor (DEXCOM G7 SENSOR) Devi Use 1 sensor every 10 days (Patient not taking: Reported on 11/23/2023) 9 each 3  . carvedilol  (COREG ) 25 MG tablet Take 2 tablets (50 mg total) by mouth two (2) times a day. 360 tablet 3  . colchicine (COLCRYS) 0.6 mg tablet Take 1/2 tablet (0.3mg ) by mouth at first sign of gout flare, then 1/2 tablet by mouth every three days until flare resolves. 15 tablet 11  . cyanocobalamin , vitamin B-12, (VITAMIN B-12) 1000 MCG tablet Take 1 tablet (1,000 mcg total) by mouth daily. 30 tablet 0  . glucagon spray 3 mg/actuation Spry Use 1 spray in 1 nostril for severe hypoglycemia as directed by physician as per package instructions 2 each 3  . insulin  aspart (NOVOLOG  FLEXPEN) 100 unit/mL (3 mL) injection pen Inject 4 units before each meal. (Patient taking differently: Inject 0.05 mL (5 Units total) under the skin Three (3) times a day before meals. Inject 4 units before each meal.) 15 mL 3  . insulin  glargine (BASAGLAR , LANTUS ) 100 unit/mL (3 mL) injection pen Inject 0.15 mL (15 Units total) under the skin nightly. (Patient taking differently: Inject 0.18 mL (18 Units total) under the skin nightly.) 15 mL 3  . insulin  syringe-needle U-100 1 mL 25 gauge x 5/8 Syrg     . isosorbide -hydrALAZINE  (BIDIL ) 20-37.5 mg per tablet Take 2 tablets by mouth Three (3) times a day. 540 tablet 3  . levETIRAcetam  (KEPPRA ) 500 MG tablet Take 1 and 1/2 tablets (750 mg total) by mouth two (2) times a day. 180 tablet 6  . metOLazone (ZAROXOLYN) 2.5 MG tablet Take 1 tablet by mouth as needed up to 3 times a week when instructed by cardiology clinic, supply to  last 30 days. 12 tablet 11  . tirzepatide (MOUNJARO) 5 mg/0.5 mL PnIj pen Inject 0.5 mL (5 mg total) under the skin every seven (7) days. 2 mL 2  . torsemide  (DEMADEX ) 20 MG tablet Take 4 tablets (80 mg total) by mouth two (2) times a day. 720 tablet 1  [3] Past Medical History: Diagnosis Date  . A-fib (CMS-HCC)    s/p ablation  . AICD (automatic cardioverter/defibrillator) present   . CHF (congestive heart failure) (CMS-HCC)   . CVA (cerebral vascular accident)    (CMS-HCC) 02/03/2023  . Diabetes mellitus (CMS-HCC)   . Hypertension   . Morbid obesity (CMS-HCC)   [4] Past Surgical History: Procedure Laterality Date  . CARDIAC DEFIBRILLATOR PLACEMENT    . PR ANASTOMOSIS,AV,ANY SITE Right 03/10/2023   Procedure: ARTERIOVENOUS ANASTOMOSIS, OPEN; DIRECT, ANY SITE, UPPER EXTREMITY;  Surgeon: Marchelle Kitchens, MD;  Location: [facility] OR [facility];  Service: General Surgery  . PR ANASTOMOSIS,AV,ANY SITE Right 10/24/2023   Procedure: ARTERIOVENOUS  ANASTOMOSIS, OPEN; DIRECT, ANY SITE, UPPER EXTREMITY;  Surgeon: Marchelle Kitchens, MD;  [facility];  Service: General Surgery  . PR AV ANAST,UP ARM BASILIC VEIN TRANSPOSIT Right 04/28/2023   Procedure: ARTERIOVENOUS ANASTOMOSIS, OPEN; BY UPPER ARM BASILIC VEIN TRANSPOSITION;  Surgeon: Marchelle Kitchens, MD;  Location: [facility] OR [facility];  Service: General Surgery  . PR CATH PLACE/CORON ANGIO, IMG SUPER/INTERP,R&L HRT CATH, L HRT VENTRIC N/A 01/11/2022   Procedure: Left/Right Heart Catheterization;  Surgeon: Eloy Fairy Rigg, MD;  Location: [facility] CATH;  Service: Cardiology  . PR COMPRE EP EVAL ABLTJ ATR FIB PULM VEIN ISOLATION N/A 03/05/2022   Procedure: Afib Ablation;  Surgeon: Gehi, Anil Kishin, MD;  Location: [facility] EP;  Service: Cardiology  . PR CYSTO/URETERO/PYELOSCOPY, CALCULUS TX Left 02/28/2018   Procedure: CYSTOURETHROSCOPY, W/URETEROSCOPY &/OR PYELOSCOPY; W/REMOVAL/MANIPULATION CALCULUS(URETERAL CATH INCLUDED);  Surgeon: Adine Alm Kays, MD;  Location: [facility] OR [facility];  Service: Urology  . PR CYSTOSCOPY,INSERT URETERAL STENT Left 02/07/2018   Procedure: CYSTOURETHROSCOPY,  WITH INSERTION OF INDWELLING URETERAL STENT (EG, GIBBONS OR DOUBLE-J TYPE). Estimated time 20 min.;  Surgeon: Adine Alm Kays, MD;  Location: [facility] OR [facility];  Service: Urology  . PR CYSTOSCOPY,INSERT URETERAL STENT Left 02/28/2018   Procedure: CYSTOURETHROSCOPY,  WITH INSERTION OF INDWELLING URETERAL STENT (EG, GIBBONS OR DOUBLE-J TYPE);  Surgeon: Adine Alm Kays, MD;  Location: [facility] OR [facility];  Service: Urology  . PR RIGHT HEART CATH O2 SATURATION & CARDIAC OUTPUT N/A 12/17/2022   Procedure: Right Heart Catheterization;  Surgeon: Eloy Fairy Rigg, MD;  Location: [facility] Cath;  Service: Cardiology  . PR RMVL IMPLTBL DFB PLSE GEN W/REPL PLSE GEN 1 LEAD N/A 12/13/2019   Procedure: Remove & Replace ICD Single Generator;  Surgeon: Donnice Garnette Lee, MD;  Location: [facility] EP;  Service: Cardiology  . PR TCAT IMPL WRLS P-ART PRS SNR L-T HEMODYN MNTR N/A 07/21/2023   Procedure: Cardiomems with RHC;  Surgeon: Drusilla Elsie Lupita DOUGLAS, MD;  Location: [facility] CATH;  Service: Cardiology  . PR TREATMENT EXTENSIVE RETINOPATHY PHOTOCOAGULATION Left 07/06/2017   Procedure: TREATMENT OF EXTENSIVE OR PROGRESSIVE RETINOPATHY (EG, DIABETIC RETINOPATHY), PHOTOCOAGULATION;  Surgeon: Diona Mana, MD;  Location[facility] OR [facility];  Service: Ophthalmology  . PR VITRECTOMY,PANRETINAL LASER RX Right 07/06/2017   Procedure: VITRECTOMY, MECHANICAL, PARS PLANA APPROACH; WITH ENDOLASER PANRETINAL PHOTOCOAGULATION;  Surgeon: Diona Mana, MD;  Location: [facility] OR [facility];  Service: Ophthalmology  [5] Social History Socioeconomic History  . Marital status: Single  Tobacco Use  . Smoking status: Never    Passive exposure: Never  . Smokeless tobacco: Never  Vaping Use  . Vaping status: Never Used  Substance and Sexual Activity  . Alcohol use: No  . Drug  use: No  Social History Narrative   Lives with sons (ages 52, 26). His middle son age 22 lives independently.   Engaged since Summer 2022   Disability since ~2011. 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   T: none   E: none   D: none   Social Drivers of Corporate Investment Banker Strain: Low Risk  (08/13/2021)   Overall Financial Resource Strain (CARDIA)   . Difficulty of Paying Living Expenses: Not hard at all  Food Insecurity: No Food Insecurity (06/13/2023)   Received from [facility]   Hunger Vital Sign   . Within the past 12 months, you worried that your food would run out before you got the money to buy more.: Never true   .  Within the past 12 months, the food you bought just didn't last and you didn't have money to get more.: Never true  Transportation Needs: No Transportation Needs (06/13/2023)   Received from [facility] - Transportation   . Lack of Transportation (Medical): No   . Lack of Transportation (Non-Medical): No  Social Connections: Unknown (06/13/2023)   Received from [facility]   Social Connection and Isolation Panel   . Are you married, widowed, divorced, separated, never married, or living with a partner?: Divorced  Housing: Low Risk  (01/06/2023)   Housing   . Within the past 12 months, have you ever stayed: outside, in a car, in a tent, in an overnight shelter, or temporarily in someone else's home (i.e. couch-surfing)?: No   . Are you worried about losing your housing?: No  [6] Family History Problem Relation Age of Onset  . Cancer Mother   . Kidney disease Father   . No Known Problems Brother

## 2023-12-01 NOTE — Consults (Signed)
 ------------------------------------------------------------------------------- Attestation with edits by Clem Jenkins Jansky, MD at 12/02/23 [id number] I saw and evaluated the patient, participating in the key portions of the service.  I reviewed the resident's note.  I agree with the resident's findings and plan.   Patient with a history of difficult to control gout related to advanced CKD and need for diuresis.  An arthrocentesis on August 30 that was negative for bacteria but showed mono urate crystals.  Has had ongoing flares since that time esp in L knee.  Reports similar flare.  No signs of septic arthritis without leukocytosis, fevers, without frank erythema over the joint and symptoms very similar to prior flares.  Limited options in the setting of CKD.  Continue dose reduce colchicine.  When he was given doses of prednisone in the past got worsening volume overload in the setting of heart failure/CKD.  Would like to avoid systemic steroids.  He has never had a steroid injection in the knee.  Suggest a steroid injection in the setting of severe ongoing pain for the gout.  Additionally can start renally-dosed colchicine.  His uric acid is very elevated at 14 with limited allopurinol  dosing and severe CKD.  He has plan to establish with rheumatology in February.  Also suggest symptomatic treatment.  I personally spent 60 minutes face-to-face and non-face-to-face in the care of this patient, which includes all pre, intra, and post visit time on the date of service.  All documented time was specific to the E/M visit and does not include any procedures that may have been performed.  [name], MD  [facility] Assistant Professor of Hospital Medicine   -------------------------------------------------------------------------------  Internal Medicine Consult Service  Assessment and Recommendations:  Terry [name] is a 48 y.o. 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 Pt was seen at the request of Penne Jama Samples, MD (Neurology (NEU)) for left knee gout.   Neurology (NEU) request that the medicine consult team place orders based on their recommendations: No  Left Knee Swelling  Gout Flare Patient with known history of gout and CKD stage V.  Uric acid level was 14.  Considered possibility for septic arthritis, however this is unlikely given hemodynamic stability, symptomatic presentation similar to previous gout episodes, lack of recent trauma or injections into his knee and acuity of presentation similar to previous gout flares. Most recent arthrocentesis completed in August demonstrated uric acid crystals.  Patient with follow-up for rheumatology in February 2026 - Continue home allopurinol  - Start colchicine 0.3 mg every 3 days until flare resolves - Recommend steroid knee injection.  Teams capable of completing this are rheumatology, internal medicine procedures team, orthopedic surgery.  Preferred team is rheumatology for possible recommendations of other management for gout flare  - Pain control with lidocaine  patches, Voltaren gel, heating pad -Tylenol  1 g every 8 hours.  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   For questions between 7:30AM-5PM, please page the Medicine Consult Service pager at [phone]. After 5PM, the Medicine Consult Service  is covered by the MED B On-Call Resident [phone]) for urgent/emergent questions or  concerns.   Reason for Consultation:  Pt was seen at the request of Penne Jama Samples, MD (Neurology (NEU)) in consultation for gout flare  Subjective:  HPI: Terry [name] is a 48 y.o. 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  Left leg is swollen, warm, and tender. Started 10/29 in the evening. Patient notes only having experience with colchicine, does not recall prior injections. No other concerns (nausea, headache, dizziness, dyspnea, chest pain). Heating pad is mildly helpful.   Allergies: Patient has no known allergies.  Medications:  Prior to Admission medications  Medication Dose, Route, Frequency  allopurinol  (ZYLOPRIM ) 100 MG tablet Take 1/2 tablet (50 mg total) by mouth every Monday, Wednesday, and Friday.  amiodarone  (PACERONE ) 200 MG tablet 200 mg, Oral, Daily (standard)  amlodipine (NORVASC) 5 MG tablet 5 mg, Oral, Daily (standard)  apixaban  (ELIQUIS ) 5 mg Tab 5 mg, Oral, 2 times a day (standard)  atorvastatin  (LIPITOR) 40 MG tablet 40 mg, Oral, Daily (standard), For cholesterol  blood-glucose sensor (DEXCOM G7 SENSOR) Devi Use 1 sensor every 10 days Patient not taking: Reported on 11/23/2023  carvedilol  (COREG ) 25 MG tablet 50 mg, Oral, 2 times a day (standard)  colchicine (COLCRYS) 0.6 mg tablet Take 1/2 tablet (0.3mg ) by mouth at first sign of gout flare, then 1/2 tablet by mouth every three days until flare resolves.  cyanocobalamin , vitamin B-12, (VITAMIN B-12) 1000 MCG tablet 1,000 mcg, Oral, Daily (standard)  glucagon spray 3 mg/actuation Spry Use 1 spray in 1 nostril for severe hypoglycemia as directed by physician as per package instructions  insulin  aspart (NOVOLOG  FLEXPEN) 100 unit/mL (3 mL) injection pen Inject 4 units before each meal. Patient taking differently: Inject 0.05 mL (5 Units total) under the skin Three (3)  times a day before meals. Inject 4 units before each meal.  insulin  glargine (BASAGLAR , LANTUS ) 100 unit/mL (3 mL) injection pen 15 Units, Subcutaneous, Nightly Patient taking differently: Inject 0.18 mL (18 Units total) under the skin nightly.  insulin  syringe-needle U-100 1 mL 25 gauge x 5/8 Syrg   isosorbide -hydrALAZINE  (BIDIL ) 20-37.5 mg per tablet 2 tablets, Oral, 3 times a day (standard)  levETIRAcetam  (KEPPRA ) 500 MG tablet Take 1 and 1/2 tablets (750 mg total) by mouth two (2) times a day.  metOLazone (ZAROXOLYN) 2.5 MG tablet Take 1 tablet by mouth as needed up to 3 times a week when instructed by cardiology clinic, supply to last 30 days.  tirzepatide (MOUNJARO) 5 mg/0.5 mL PnIj pen 5 mg, Subcutaneous, Every 7 days  torsemide  (DEMADEX ) 20 MG tablet 80 mg, Oral, 2 times a day    Medical History: Past Medical History[1]  Surgical History: Past Surgical History[2]  Social History: Short Social History[3]   Family History: Family History[4]  Review of Systems: 10 systems were reviewed and are negative unless otherwise mentioned in the HPI  Objective:  Physical Exam: Temp:  [36.4 C (97.5 F)-36.9 C (98.4 F)] 36.9 C (98.4 F) Pulse:  [66-150] 82 Resp:  [16-20] 18 BP: (122-179)/(69-99) 141/84 SpO2:  [96 %-99 %] 96 %  Gen: WDWN in NAD, answers questions appropriately Eyes: Sclera anicteric Heart: RRR, S1, S2, no M/R/G, no chest wall tenderness Lungs: CTAB, no crackles or wheezes, no use of accessory muscles Abdomen: Normoactive bowel sounds, non distended Extremities:  Left knee swollen, warm and tender. Bilateral mild edema, non pitting Neuro: CN II-XI grossly intact, no focal deficits  Psych: Alert and oriented, normal mood and affect.   Labs/Studies: Labs and Studies from the last 24hrs per EMR and Reviewed  Imaging: Radiology studies were personally reviewed        [1] Past Medical History: Diagnosis Date  . A-fib (CMS-HCC)    s/p ablation  . AICD  (automatic cardioverter/defibrillator) present   . CHF (congestive heart failure) (CMS-HCC)   . CVA (cerebral vascular accident)    (CMS-HCC) 02/03/2023  . Diabetes mellitus (CMS-HCC)   . Hypertension   . Morbid obesity (CMS-HCC)   [2] Past Surgical History: Procedure Laterality Date  . CARDIAC DEFIBRILLATOR PLACEMENT    . PR ANASTOMOSIS,AV,ANY SITE Right 03/10/2023   Procedure: ARTERIOVENOUS ANASTOMOSIS, OPEN; DIRECT, ANY SITE, UPPER EXTREMITY;  Surgeon: Marchelle Kitchens, MD;  Location[facility];  Service: General Surgery  . PR ANASTOMOSIS,AV,ANY SITE Right 10/24/2023   Procedure: ARTERIOVENOUS ANASTOMOSIS, OPEN; DIRECT, ANY SITE, UPPER EXTREMITY;  Surgeon: Marchelle Kitchens, MD;  [facility];  Service: General Surgery  . PR AV ANAST,UP ARM BASILIC VEIN TRANSPOSIT Right 04/28/2023   Procedure: ARTERIOVENOUS ANASTOMOSIS, OPEN; BY UPPER ARM BASILIC VEIN TRANSPOSITION;  Surgeon: Marchelle Kitchens, MD;  Location: [facility];  Service: General Surgery  . PR CATH PLACE/CORON ANGIO, IMG SUPER/INTERP,R&L HRT CATH, L HRT VENTRIC N/A 01/11/2022   Procedure: Left/Right Heart Catheterization;  Surgeon: Eloy Fairy Rigg, MD;  Location: [facility] CATH;  Service: Cardiology  . PR COMPRE EP EVAL ABLTJ ATR FIB PULM VEIN ISOLATION N/A 03/05/2022   Procedure: Afib Ablation;  Surgeon: Gehi, Anil Kishin, MD;  Location: [facility] EP;  Service: Cardiology  . PR CYSTO/URETERO/PYELOSCOPY, CALCULUS TX Left 02/28/2018   Procedure: CYSTOURETHROSCOPY, W/URETEROSCOPY &/OR PYELOSCOPY; W/REMOVAL/MANIPULATION CALCULUS(URETERAL CATH INCLUDED);  Surgeon: Adine Alm Kays, MD;  Location: [facility];  Service: Urology  . PR CYSTOSCOPY,INSERT URETERAL STENT Left 02/07/2018   Procedure: CYSTOURETHROSCOPY,  WITH INSERTION OF INDWELLING URETERAL STENT (EG, GIBBONS OR DOUBLE-J TYPE). Estimated time 20 min.;  Surgeon: Adine Alm Kays, MD;  [facility];  Service: Urology  . PR  CYSTOSCOPY,INSERT URETERAL STENT Left 02/28/2018   Procedure: CYSTOURETHROSCOPY,  WITH INSERTION OF INDWELLING URETERAL STENT (EG, GIBBONS OR DOUBLE-J TYPE);  Surgeon: Adine Alm Kays, MD;  [facility] OR [facility];  Service: Urology  . PR RIGHT HEART CATH O2 SATURATION & CARDIAC OUTPUT N/A 12/17/2022   Procedure: Right Heart Catheterization;  Surgeon: Eloy Fairy Rigg, MD;  Location: [facility] Cath;  Service: Cardiology  . PR RMVL IMPLTBL DFB PLSE GEN W/REPL PLSE GEN 1 LEAD N/A 12/13/2019   Procedure: Remove & Replace ICD Single Generator;  Surgeon: Donnice Garnette Lee, MD;  Location: [facility] EP;  Service: Cardiology  . PR TCAT IMPL WRLS P-ART PRS SNR L-T HEMODYN MNTR N/A 07/21/2023   Procedure: Cardiomems with RHC;  Surgeon: Drusilla Elsie Lupita DOUGLAS, MD;  Location: [facility] CATH;  Service: Cardiology  . PR TREATMENT EXTENSIVE RETINOPATHY PHOTOCOAGULATION Left 07/06/2017   Procedure: TREATMENT OF EXTENSIVE OR PROGRESSIVE RETINOPATHY (EG, DIABETIC RETINOPATHY), PHOTOCOAGULATION;  Surgeon: Diona Mana, MD;  [facility];  Service: Ophthalmology  . PR VITRECTOMY,PANRETINAL LASER RX Right 07/06/2017   Procedure: VITRECTOMY, MECHANICAL, PARS PLANA APPROACH; WITH ENDOLASER PANRETINAL PHOTOCOAGULATION;  Surgeon: Diona Mana, MD;  Location: [facility] OR [facility];  Service: Ophthalmology  [3] Social History Tobacco Use  . Smoking status: Never  Passive exposure: Never  . Smokeless tobacco: Never  Vaping Use  . Vaping status: Never Used  Substance Use Topics  . Alcohol use: No  . Drug use: No  [4] Family History Problem Relation Age of Onset  . Cancer Mother   . Kidney disease Father   . No Known Problems Brother

## 2023-12-01 NOTE — Care Plan (Signed)
 Shift Summary Blood pressure and MAP decreased steadily throughout the shift.  Safety and skin protection interventions were maintained, including frequent repositioning and use of pressure-redistributing devices.  Patient continued to feed self and required only standby assistance for mobility.  Overall, comfort and safety were supported and no new hospital-acquired issues were documented.  Vss. No BM overnight. Voids using the urinal. No push button events overnight. Repositioning encouraged. Denies any further needs at this time. Will continue to monitor.  Absence of Hospital-Acquired Illness or Injury: Skin remained within defined limits and pressure reduction techniques were consistently encouraged; safety interventions such as bed alarms and fall reduction programs were maintained throughout the shift, and no new device-related skin issues were documented.   Optimal Comfort and Wellbeing: Pain was consistently assessed and remained at 0 during the shift, with acetaminophen  administered PRN and patient observed sleeping comfortably.   Readiness for Transition of Care: Blood pressure and MAP trended downward over the shift, and patient continued to feed self and required only standby assistance for mobility; unplanned readmission score increased slightly.   Optimal Coping: Psychosocial status was documented as within defined limits, and no cultural requests were made during hospitalization.

## 2023-12-01 NOTE — Progress Notes (Signed)
 ------------------------------------------------------------------------------- Attestation signed by [name] Terry Ruth, MD at 12/01/23 1836 Attestation signed by Terry Darral, MD on 12/01/2023  Attending Attestation Briefly 48 yo M with multiple medical comorbidities who was admitted on 11/30/23 for diagnostic characterization of typical seizures.  Since admission, no seizures have been captured.  Interictal EEG was normal.  Most pressing concern has been gout flare of L knee on admission.  Will ask internal medicine service to consult for gout flare, regarding possible aspiration or other treatment of knee; recommendations appreciated.  Otherwise, will hold Keppra  and start photic stimulation today to try to provoke typical event.  I saw and evaluated the patient, participating in the key portions of the service.  I reviewed the NP's note, I agree with the findings and plan.  It was medically necessary for me to see the patient in addition to the APP. I personally performed the substantive portion of this visit, which included MDM.   Terry Darral, MD.   -------------------------------------------------------------------------------    Inpatient Progress Note      Assessment and Plan        LOS: 1 day  Principal Problem:   Seizure-like activity    (CMS-HCC) Active Problems:   Type 2 diabetes mellitus with stage 5 chronic kidney disease not on chronic dialysis, with long-term current use of insulin  (CMS-HCC)   Atrial fibrillation    (CMS-HCC)   Chronic systolic heart failure (CMS-HCC)   Chronic kidney disease, stage III (moderate) (CMS-HCC)   Hypertension associated with chronic kidney disease due to type 2 diabetes mellitus (CMS-HCC)   Morbid obesity (CMS-HCC)   AICD (automatic cardioverter/defibrillator) present  Terry [name] is a 48 y.o. male with a past medical history of 2 events possible for epilepsy  who presented on 11/30/2023 for diagnostic evaluation to  determine whether their typical events have an electrographic correlate.   Patient was referred to EMU by Dr. Myra.   Typical Events:  Right arm numbness, SOB, LOA and post- event fatigue.   Home medications:  levetiracetam  750mg  BID   Plan: 11/30/2023: Admit to EMU. CvEEG. Continuous cardiac monitoring.  Continuous oxygen saturation. Admission labs: CBC with HGB at 9.8 , CMP with creatinine at 5.56 and eGFR at 12  albumin  3.3. Stable safety labs. Levetiracetam  level was therapeutic at 33. Antiseizure taper plan: continue home medication for baseline EEG.  For other activating procedures no provoking procedures 12/01/2023: CVEEG. No event over night. Normal interictal EEG.  Anti- seizure taper plan: hold levetiracetam .  For other activating procedures photic stimulation. Medicine consult for management of gout flare up  Rescue Plan:10 mg Diazepam IV.  CONFIRM eeg correlate prior to treatment   convulsive seizures OR non-convulsive seizure lasting more than 5 minutes OR 3 or more of any seizures within 4 hours.  Page Dr.Waters to assist with EEG confirmation and management questions.   May repeat in 4 hours if needed. Max dose of 20 mg/24 hours. Notify EMU team (day time) or resident on call (when EMU team is off).  If IV access is lost, give Diazepam 10mg  IM   Overnight EEG: normal  Admission labs: CBC with HGB at 9.8 , CMP with creatinine at 5.56 and eGFR at 12  albumin  3.3. Stable safety labs. Levetiracetam  level was therapeutic at 33.  EMU Protocol -Continuous video EEG monitoring  -Continue seizure precautions including padded siderails, cardiac telemetry,  falls precautions with bed alarm,OOB with assistance only, nursing seizure testing, and frequent vital signs and neuro check. -Maintain a peripheral IV -  In case of seizure, please follow EMU seizure protocol   Monitoring during this admission: Diabetes-glargine insulin , sliding scale CHF-torsemide , isosorbide -hydralazine ,  amiodarone  Obesity- Mounjaro every Mondays.  HTN- amlodipine Gout- allopurinol , colchicine Atrial fibrillation - apixaban  Cholesterolemia- atovastatin     FEN/GI - replete lytes prn  - regular diet    PPx - DVT: Lovenox 40 mg Daily  - GI: no indication for PPI at this time    Access: PIV   Code Status: full code. Confirmed on Admission.   Contact Information PCP: Terry Barnie Dragon, MD Primary Contact: Terry [name] (Brother) [phone] (Home Phone)    DISPO: EMU, Neurology floor status  Pending further characterization of seizure activity   Discharge Planning:  - Case management: request assistance with discharge  - Social work: N/A - PT: N/A - OT: N/A - SLP: not applicable - PM&R: N/A - Expected Discharge Disposition: TBD.   This patient was seen and discussed with Dr. Darral who agrees with the above assessment and plan.       I directly provided 60  minutes of acute care time as documented in this note. Time includes: direct patient care, patient reassessment, coordination of patient care, interpretation of data, review of patient medical records, patient education, counseling and documentation of patient care. This time is exclusive of separately billable procedures.    Terry Rockers, FNP [facility] EMU  [phone]   Subjective   Patient report gout flare up with swollen and painful left knee.  No event over night.  No family at bedside. Allopurinol , colchicine and acetaminophen  are not helping with pain.    Objective   Vital Signs:  Temp:  [36.5 C (97.7 F)-36.9 C (98.4 F)] 36.7 C (98.1 F) Pulse:  [66-83] 73 Resp:  [16-20] 18 BP: (128-179)/(69-99) 128/69 MAP (mmHg):  [85-121] 85 SpO2:  [97 %-99 %] 97 % No intake/output data recorded.  Physical Exam: General Appearance:Well appearing. In no acute distress Mood: normal HEENT: Head is atraumatic and normocephalic. Sclera anicteric without injection. Oropharyngeal membranes are moist with no erythema or  exudate. Poor dentition with caries and missing teeth Neck: Supple. Lungs: Normal work of breathing. Clear to auscultation in anterior fields. No wheezes or crackles. Heart: Regular rate and rhythm. No murmurs, rubs, or gallops. Abdomen: Soft, nontender, nondistended. Extremities: No clubbing, cyanosis.  Swollen left knee with pain to touch or movement  Neurological Examination:   Mental Status: Alert, conversant, able to follow conversation and interview. Spontaneous speech was fluent without word finding pauses, dysarthria, or paraphasic errors. Comprehension was intact. Memory for recent and remote events was intact.  Cranial Nerves: PERRL. Pursuit eye movements were uninterrupted with full range and without more than end-gaze nystagmus. Facial sensation intact bilaterally to light touch in all three divisions of CNV. Face symmetric at rest. Normal facial movement bilaterally, including forehead, eye closure and grimace/smile. Hearing intact to conversation. Shoulder shrug full strength bilaterally. Palate movement is symmetric. Tongue protrudes midline and tongue movements are normal.   Motor Exam: Normal bulk. No tremors, myoclonus, or other adventitious movement. Pronator drift is absent. RUE: 5/5 deltoid, 5/5 biceps, 5/5 triceps, 5/5 hand grip LUE: 5/5 deltoid, 5/5 biceps, 5/5 triceps, 5/5 hand grip RLE: 5/5 iliopsoas, 5/5 quadriceps, 5/5 hamstrings, 5/5 plantar flexion, 5/5 dorsiflexion LLE:  painful with movement.  Defer strength testing.   Reflexes:. Toes are downgoing bilaterally.  Sensory: Sensation normal to light touch and temperature sensation to cold in both hands and both feet and to vibration distally in the fingers  and toes.    Cerebellar/Coordination/Gait: Rapid alternating movements are normal in bilateral upper extremities. Finger-to-nose is normal without ataxia or dysmetria bilaterally. Heel-to-shin is normal without ataxia or dysmetria bilaterally.     Gait: deferred  due to seizure precautions.  No clinical seizures noted during exam.  Medications: Scheduled medications: Scheduled Medications[1] Continuous infusions: Infusions Meds[2] PRN medications: PRN Medications[3]   Diagnostic Studies    All Labs Last 24hrs:  Recent Results (from the past 24 hours)  POCT Glucose   Collection Time: 11/30/23  8:46 PM  Result Value Ref Range   Glucose, POC 119 70 - 179 mg/dL  Comprehensive Metabolic Panel   Collection Time: 11/30/23  8:58 PM  Result Value Ref Range   Sodium 145 135 - 145 mmol/L   Potassium 4.1 3.4 - 4.8 mmol/L   Chloride 106 98 - 107 mmol/L   CO2 22.0 20.0 - 31.0 mmol/L   Anion Gap 17 (H) 5 - 14 mmol/L   BUN 72 (H) 9 - 23 mg/dL   Creatinine 4.43 (H) 9.26 - 1.18 mg/dL   BUN/Creatinine Ratio 13    eGFR CKD-EPI (2021) Male 12 (L) >=60 mL/min/1.28m2   Glucose 106 70 - 179 mg/dL   Calcium  8.9 8.7 - 10.4 mg/dL   Albumin  3.3 (L) 3.4 - 5.0 g/dL   Total Protein 7.1 5.7 - 8.2 g/dL   Total Bilirubin 0.5 0.3 - 1.2 mg/dL   AST 11 <=65 U/L   ALT <7 (L) 10 - 49 U/L   Alkaline Phosphatase 97 46 - 116 U/L  Keppra  (Levetiracetam )   Collection Time: 11/30/23  8:58 PM  Result Value Ref Range   Levetiracetam  Level 33.1 6.0 - 46.0 ug/mL  CBC w/ Differential   Collection Time: 11/30/23  8:59 PM  Result Value Ref Range   WBC 5.6 3.6 - 11.2 10*9/L   RBC 3.34 (L) 4.26 - 5.60 10*12/L   HGB 9.8 (L) 12.9 - 16.5 g/dL   HCT 69.4 (L) 60.9 - 51.9 %   MCV 91.5 77.6 - 95.7 fL   MCH 29.2 25.9 - 32.4 pg   MCHC 31.9 (L) 32.0 - 36.0 g/dL   RDW 85.2 87.7 - 84.7 %   MPV 8.1 6.8 - 10.7 fL   Platelet 203 150 - 450 10*9/L   Neutrophils % 66.9 %   Lymphocytes % 16.3 %   Monocytes % 12.0 %   Eosinophils % 3.5 %   Basophils % 1.3 %   Absolute Neutrophils 3.7 1.8 - 7.8 10*9/L   Absolute Lymphocytes 0.9 (L) 1.1 - 3.6 10*9/L   Absolute Monocytes 0.7 0.3 - 0.8 10*9/L   Absolute Eosinophils 0.2 0.0 - 0.5 10*9/L   Absolute Basophils 0.1 0.0 - 0.1 10*9/L  Toxicology  Screen, Urine   Collection Time: 12/01/23  3:50 AM  Result Value Ref Range   Amphetamines Screen, Ur Negative <500 ng/mL   Barbiturates Screen, Ur Negative <200 ng/mL   Benzodiazepines Screen, Urine Negative <200 ng/mL   Cannabinoids Screen, Ur Negative <20 ng/mL   Methadone Screen, Urine Negative <300 ng/mL   Cocaine(Metab.)Screen, Urine Negative <150 ng/mL   Opiates Screen, Ur Negative <300 ng/mL   Fentanyl  Screen, Ur Negative <1.0 ng/mL   Oxycodone  Screen, Ur Negative <100 ng/mL   Buprenorphine, Urine Negative <5 ng/mL                  [1] . allopurinol   50 mg Oral Mon,Wed,Fri  . amiodarone   200 mg Oral Daily  .  amlodipine  5 mg Oral Daily  . apixaban   5 mg Oral BID  . atorvastatin   40 mg Oral Daily  . carvedilol   50 mg Oral BID  . cyanocobalamin  (vitamin B-12)  1,000 mcg Oral Daily  . docusate sodium   100 mg Oral BID  . isosorbide  dinitrate  40 mg Oral Q8H SCH   And  . hydrALAZINE   75 mg Oral Q8H SCH  . insulin  glargine  18 Units Subcutaneous Nightly  . levETIRAcetam   750 mg Oral BID  . torsemide   80 mg Oral BID  [2] [3] acetaminophen , colchicine, diazePAM, metOLazone, ondansetron , polyethylene glycol, senna

## 2023-12-02 NOTE — Discharge Summary (Signed)
 Attending Attestation   Briefly 48 yo M with multiple medical comorbidities who was admitted on 11/30/23 for diagnostic characterization of typical seizures.  Most pressing concern has been gout flare of L knee on admission.  Medicine and Rheumatology were consulted for left knee gout.  Multiple medication change recommendations were initiated as well as intra-articular knee steroid injection.  This yielded rapid improvement.     Today, 1 smaller typical event of shortness of breath, disorientation, decreased responsiveness, and fatigue afterwards was captured on day 4 of recording.  This event did not have ictal EEG correlate, which suggests that this event was epileptic in etiology.  Otherwise, interictal EEG was normal.  Ultimately, these events are not consistent with underlying epilepsy diagnosis.  Given that these events are typically triggered by activity (walking to bathroom, walking up stairs, & mowing the lawn), these episodes are most concerning for cardiogenic etiology given history of arrhythmia and CHF.    Prior to discharge, AICD was interrogated to evaluate for current arrhythmia given that telemetry has picked up multiple runs of nonsustained ventricular tachycardia (up to 6 seconds in duration) without clear patient symptomatology.  Primary EP and heart failure cardiology teams were also contacted, who provided recommendations.  Ultimately, pt is being scheduled for expedited evaluation in heart failure clinic for increased fluid status.  This was discussed with patient on date of detail.   Otherwise, it is ok to discharge patient off of Keppra  given low concern for epileptic etiology.  Ok to keep follow-up appointment with neurology for now.  If episodes resolve with optimization of cardiac health, then future neurology appointment can be cancelled unless new issues arise.   I saw and evaluated the patient on the date of discharge, participating in the key portions of the service.  I  reviewed the NP's note, I agree with the findings and plan.  It was medically necessary for me to see the patient in addition to the APP. I personally performed the substantive portion of this visit, which included MDM.   Penne Samples, MD.     Physician Discharge Summary  Admit date: 11/30/2023  Discharge date: 12/05/2023  Discharge to: Home  Discharge Service: Neurology (NEU)  Discharge Attending Physician: Penne Jama Samples, MD  Discharge Diagnoses:  Principal Problem:   Transient alteration of awareness Active Problems:   Type 2 diabetes mellitus with stage 5 chronic kidney disease not on chronic dialysis, with long-term current use of insulin  (CMS-HCC)   Atrial fibrillation    (CMS-HCC)   Chronic systolic heart failure (CMS-HCC)   Chronic kidney disease, stage III (moderate) (CMS-HCC)   Hypertension associated with chronic kidney disease due to type 2 diabetes mellitus (CMS-HCC)   Morbid obesity (CMS-HCC)   AICD (automatic cardioverter/defibrillator) present Procedures:  Continuous electroencephalography Continuous cardiac telemetry Continuous peripheral oxygenation Admission safety labs and antiepileptic medication levels  Pertinent Test Results:  VEEG: Please see separate EEG report for details In brief:  typical events were not captured. CLINICAL CORRELATION / SUMMARY  This study was normal in the awake and asleep state.  No epileptiform discharges or seizures were seen.   Labs:   Latest Reference Range & Units 11/30/23 20:58 11/30/23 20:59  WBC 3.6 - 11.2 10*9/L  5.6  RBC 4.26 - 5.60 10*12/L  3.34 (L)  HGB 12.9 - 16.5 g/dL  9.8 (L)  HCT 60.9 - 51.9 %  30.5 (L)  MCV 77.6 - 95.7 fL  91.5  MCH 25.9 - 32.4 pg  29.2  MCHC  32.0 - 36.0 g/dL  68.0 (L)  RDW 87.7 - 84.7 %  14.7  MPV 6.8 - 10.7 fL  8.1  Platelet 150 - 450 10*9/L  203  Neutrophils % %  66.9  Lymphocytes % %  16.3  Monocytes % %  12.0  Eosinophils % %  3.5  Basophils % %  1.3  Absolute  Neutrophils 1.8 - 7.8 10*9/L  3.7  Absolute Lymphocytes 1.1 - 3.6 10*9/L  0.9 (L)  Absolute Monocytes  0.3 - 0.8 10*9/L  0.7  Absolute Eosinophils 0.0 - 0.5 10*9/L  0.2  Absolute Basophils  0.0 - 0.1 10*9/L  0.1  Sodium 135 - 145 mmol/L 145   Potassium 3.4 - 4.8 mmol/L 4.1   Chloride 98 - 107 mmol/L 106   CO2 20.0 - 31.0 mmol/L 22.0   Bun 9 - 23 mg/dL 72 (H)   Creatinine 9.26 - 1.18 mg/dL 4.43 (H)   BUN/Creatinine Ratio  13   eGFR CKD-EPI (2021) Male >=60 mL/min/1.56m2 12 (L)   Anion Gap 5 - 14 mmol/L 17 (H)   Glucose 70 - 179 mg/dL 893   Calcium  8.7 - 10.4 mg/dL 8.9   Albumin  3.4 - 5.0 g/dL 3.3 (L)   Total Protein 5.7 - 8.2 g/dL 7.1   Total Bilirubin 0.3 - 1.2 mg/dL 0.5   SGOT (AST) <=65 U/L 11   ALT 10 - 49 U/L <7 (L)   Alkaline Phosphatase 46 - 116 U/L 97   Uric Acid 3.7 - 9.2 mg/dL 85.6 (H)   (L): Data is abnormally low (H): Data is abnormally high  Latest Reference Range & Units 12/01/23 03:50  Methadone Screen, Urine <300 ng/mL  Negative  Opiate Scrn, Ur <300 ng/mL  Negative  Barbiturate Screen, Ur <200 ng/mL  Negative  Amphetamine Screen, Ur <500 ng/mL  Negative  Cocaine(Metab.)Screen, Urine <150 ng/mL  Negative  Benzodiazepine Screen, Urine <200 ng/mL  Negative  Cannabinoid Scrn, Ur <20 ng/mL  Negative  Oxycodone  Screen, Ur <100 ng/mL  Negative  Buprenorphine, Urine <5 ng/mL  Negative  Fentanyl  Screen, Ur <1.0 ng/mL  Negative   Hospital Course:  Terry [name] is a 48 y.o. male with a past medical history of 2 events possible for epilepsy  who presented on 11/30/2023 for diagnostic evaluation to determine whether their typical events have an electrographic correlate.      Typical Events:  Right arm numbness, SOB, LOA and post- event fatigue.  Patient had a life time of 2 event in April and August 2025. - no event captured during this admission  During this admission the patient's home AEDs were tapered down and activating procedures were initiated to provoke  typical seizures for further characterization.   Home medications: levetiracetam  750mg  BID   Below is the progression of antiepileptic drugs and seizure activing procedures during this admission:  11/30/2023: Admit to EMU. CvEEG. Continuous cardiac monitoring.  Continuous oxygen saturation. Admission labs: CBC with HGB at 9.8 , CMP with creatinine at 5.56 and eGFR at 12  albumin  3.3. Stable safety labs. Levetiracetam  level was therapeutic at 33. Antiseizure taper plan: continue home medication for baseline EEG.  For other activating procedures no provoking procedures 12/01/2023: CVEEG. No event over night. Normal interictal EEG.  Anti- seizure taper plan: hold levetiracetam .  For other activating procedures photic stimulation. Medicine consult for management of gout flare up is appreciated.  Will consult with rheumatology for local steroid injection and further management of gout recommendations.  12/02/2023: CVEEG. No event  over night. Normal interictal EEG.  Anti- seizure taper plan: hold levetiracetam .  For other activating procedures sleep deprivation 12-6am.  We appreciate rheumatology evaluated patient for gout flare up. Monitoring BS at ac and hs for today.  If blood sugar values are stable, may consider discontinue frequent BS check.  12/03/2023: CVEEG. No event over night. Normal interictal EEG.  Anti- seizure taper plan: hold levetiracetam .  For other activating procedures - sleep deprivation 12-6am.   12/04/2023: CVEEG.  1 milder typical event today without ictal EEG correlate.  Normal interictal EEG.  Anti- seizure taper plan: hold levetiracetam .  No other sz provoking techniques.  12/05/23: cvEEG. No events overnight. AICD interrogated by Electrophysiology team. No arrhythmia episodes since 10/07/2023. Evidence of Possible Fluid Accumulation. Finndings discussed with patient's primary cardiology provider Damien Brodie Lee, NP who will follow up next week.  Discharge antiepileptic medications:  Dicontinued levetiracetam  as his EEG has been normal and prior events were likely cardiac related given his cardiac history.   cvEEG: Please see the EEG report for full detailed results. In brief,no typical events captured.  Interictal normal EEG does not rule out the underlying epilepsy diagnosis especially in the setting of no typical clinical event captured.    Follow up: will follow up with Dr. Josette Clause in neurology clinic on May 02, 2024.  Patient should contact provider if he has recurrent event.   Labs: Admission labs checked on 11/30/2023- stable    EMU Protocol was maintained during this admission: seizure precautions including padded siderails, cardiac telemetry, falls precautions with bed alarm,OOB with assistance only, nursing seizure testing, and frequent vital signs and neuro check. Maintaining a peripheral IV access. The EMU Psychosocial Screen completed. Lovenox and SCD were used as DVT prophylaxis.   EMU Quality Measures 1)   Does the patient have medically refractory epilepsy?: No 2)   Is the patient appropriate for presurgical evaluation for medically refractory epilepsy?: No 3)   Was surgical intervention for medically refractory epilepsy discussed?: No 4)   Hospital Length of Stay:  5  Following chronic stable medical conditions were also managed during this admission:  Diabetes-glargine insulin , sliding scale CHF-torsemide , isosorbide -hydralazine , amiodarone  Obesity- Mounjaro every Mondays.  HTN- amlodipine Gout- allopurinol , colchicine- discontinue per rheumatology recommendation due to severe renal disease and high dose of carvedilol  Atrial fibrillation - apixaban  Cholesterolemia- atovastatin  Physical Exam  Vital Sign: Blood pressure 102/55, pulse 63, temperature 36.5 C (97.7 F), temperature source Oral, resp. rate 16, height 172.7 cm (5' 7.99), weight (!) 131.5 kg (289 lb 12.8 oz), SpO2 99%. General: No acute distress, Well developed/groomed/nourished   Eyes: Pupil equal round reactive to light and accomodation. Extra occular muscles intact, and sclera clear  ENT: poor dentition. Skin: No rash/lesions/breakdown  Psychiatry : appropriate   Extremities: Swollen left knee from recent gout flare. Trace edema b/l ankles  L > R lower extremities. Musculo Skeletal:No joint tenderness, deformity, effusions.   Neurological:   Mental Status:  Alert and Oriented to person,place or date and time. Clear speech, intact naming and repetition, follows commands well, normal fund of knowledge and attention span.  Cranial Nerves: II-XII intact.   EOMI.no nystagmus noted. Symmetrical facial sensation and movement, hearing, palatal rise, tongue protrusion and shoulder shrugs.   Motor: normal bulk and tone throughout.   Strength 5/5 equal bilateral in upper extremities. Left LE - able to raise anti-gravity for 5 sec. Further testing deferred due to pain Right leg - 5/5 iliopsoas, 5/5 quadriceps, 5/5 hamstrings, 5/5 plantar  flexion, 5/5 dorsiflexion   Sensory: intact to light touch and temperature throughout  Coordination: no tremors noted   no dysmetria noted with finger-nose-finger.  absent dysdiokinesis with rapid alternating movement bilaterally. Gait:Casual gait was normal and slow.    Condition at Discharge: stable Discharge Medications:    Your Medication List     STOP taking these medications    DEXCOM G7 SENSOR Devi Generic drug: blood-glucose sensor   levETIRAcetam  500 MG tablet Commonly known as: KEPPRA        START taking these medications    anakinra 100 mg/0.67 mL Syrg Commonly known as: KINERET Inject 0.67 mL (100 mg total) under the skin daily as needed (for 3-5 days with gout flares).       CHANGE how you take these medications    insulin  aspart 100 unit/mL (3 mL) injection pen Commonly known as: NovoLOG  FLEXPEN Inject 4 units before each meal. What changed:  how much to take how to take this when to take this    insulin  glargine 100 unit/mL (3 mL) injection pen Commonly known as: BASAGLAR , LANTUS  Inject 0.18 mL (18 Units total) under the skin nightly. What changed: how much to take   insulin  glargine 100 unit/mL injection Commonly known as: LANTUS  Inject 0.18 mL (18 Units total) under the skin nightly. What changed: You were already taking a medication with the same name, and this prescription was added. Make sure you understand how and when to take each.   metOLazone 2.5 MG tablet Commonly known as: ZAROXOLYN Take 1 tablet by mouth as needed up to 3 times a week when instructed by cardiology clinic, supply to last 30 days. What changed: Another medication with the same name was added. Make sure you understand how and when to take each.   metOLazone 2.5 MG tablet Commonly known as: ZAROXOLYN Take 1 tablet (2.5 mg total) by mouth two (2) times a day as needed (per cardiology). What changed: You were already taking a medication with the same name, and this prescription was added. Make sure you understand how and when to take each.       CONTINUE taking these medications    acetaminophen  325 MG tablet Commonly known as: TYLENOL  Take 2 tablets (650 mg total) by mouth daily as needed for pain.   allopurinol  100 MG tablet Commonly known as: ZYLOPRIM  Take 1/2 tablet (50 mg total) by mouth every Monday, Wednesday, and Friday.   amiodarone  200 MG tablet Commonly known as: PACERONE  Take 1 tablet (200 mg total) by mouth daily.   amlodipine 5 MG tablet Commonly known as: NORVASC Take 1 tablet (5 mg total) by mouth daily.   atorvastatin  40 MG tablet Commonly known as: LIPITOR Take 1 tablet (40 mg total) by mouth daily. For cholesterol   carvedilol  25 MG tablet Commonly known as: COREG  Take 2 tablets (50 mg total) by mouth two (2) times a day.   colchicine 0.6 mg tablet Commonly known as: COLCRYS Take 1/2 tablet (0.3mg ) by mouth at first sign of gout flare, then 1/2 tablet by mouth  every three days until flare resolves.   cyanocobalamin  (vitamin B-12) 1000 MCG tablet Commonly known as: vitamin B-12 Take 1 tablet (1,000 mcg total) by mouth daily.   ELIQUIS  5 mg Tab Generic drug: apixaban  Take 1 tablet (5 mg total) by mouth two (2) times a day.   glucagon spray 3 mg/actuation Spry Use 1 spray in 1 nostril for severe hypoglycemia as directed by physician as per package instructions  insulin  syringe-needle U-100 1 mL 25 gauge x 5/8 Syrg   isosorbide -hydrALAZINE  20-37.5 mg per tablet Commonly known as: BiDiL  Take 2 tablets by mouth Three (3) times a day.   MOUNJARO 5 mg/0.5 mL Pnij pen Generic drug: tirzepatide Inject 0.5 mL (5 mg total) under the skin every seven (7) days.   torsemide  20 MG tablet Commonly known as: DEMADEX  Take 4 tablets (80 mg total) by mouth two (2) times a day.        Pending Test Results:   Pending Labs     Order Current Status   Quant TB AG1 value In process   Quant TB AG2 value In process   Quant TB Mitogen value In process   Quant TB Nil value In process   Quantiferon TB Gold Plus In process   Quantiferon TB Gold Plus In process       Discharge Instructions:  Activity Instructions     Activity as tolerated        Other Instructions     Call MD for:     Call for recurrent seizures  Dr. Myra HOUSTON Neurology Department [address] [place]  [place] [address] Clinic Main line: [phone].  Clinic Fax number: [phone]   Discharge instructions     You have been admitted to [facility] for evaluation and treatment of seizures with video EEG monitoring. You had one episode with shortness of breath, disorientation and fatigue after returning from the bathroom which did not have an EEG correlate.  When you leave the hospital, STOP taking Keppra .  Our office will contact you to schedule a follow up appointment with Dr. Josette Myra. Contact our office if you have any breakthrough  seizures or concerns.  [facility] Neurology clinic: [phone].   Since your events may happen at any time, It is important to take certain precautions to maintain your safety. When possible, take showers instead of baths, as it is possible to drown in even shallow water during a seizure. Do not swim unsupervised or in open water where rescue could be difficult. Do not climb to heights and do not operate heavy machinery. When cooking, use the back burners of the stove and avoid open flames or hot stove tops. Avoid any activities which could be dangerous in the event of a loss of consciousness.  Do not drive until approved by your neurologist.  We had the cardiology nurse interrogate your AICD and they recommended follow up to address fluid accumulation. You will also have a follow up appointment scheduled with your heart failure clinic provider.  We wish you the best of recoveries [facility] Neurology Team.     Follow Up instructions and Outpatient Referrals    Call MD for:     Discharge instructions      Appointments which have been scheduled for you    Dec 28, 2023 10:30 AM (Arrive by 10:15 AM) RETURN HEART FAILURE with Damien Brodie Lee, AGNP [facility] CARDIOLOGY [place] [facility] REGION) [address] [facility] 1 through 4 [place] [address] [phone]     Jan 09, 2024 11:00 AM (Arrive by 10:45 AM) RETURN VIDEO VISIT DIRECT LINK with Arlean Ip Rodino, CPP [facility] CARDIOLOGY [place] [facility] REGION) [address] [facility] 1 through 4 [place] [address] [phone]  Please sign into My [facility] Chart at least 15 minutes before your appointment to complete the eCheck-In process. You must complete eCheck-In before you can  start your video visit. We also recommend testing your audio and video connection to troubleshoot any issues before your visit begins. Click "Join Video Visit" to complete these checks. Once you have completed eCheck-In and  tested your audio and video, click "Join Call" to connect to your visit.   For your video visit, you will need a computer with a working camera, speaker and microphone, a smartphone, or a tablet with internet access.  My [facility] Chart enables you to manage your health, send non-urgent messages to your provider, view your test results, schedule and manage appointments, and request prescription refills securely and conveniently from your computer or mobile device.  You can go to [email or web address] to sign in to your [facility] Chart account with your username and password. If you have forgotten your username or password, please choose the "Forgot Username?" and/or "Forgot Password?" links to gain access. You also can access your [facility] Chart account with the free MyChart mobile app for Android or iPhone.  If you need assistance accessing your [facility] Chart account or for assistance in reaching your provider's office to reschedule or cancel your appointment, please call [facility] [phone].     Jan 19, 2024 2:20 PM (Arrive by 2:05 PM) RETURN CONTINUITY with Barnie Arlean Brunet, MD [facility] [place] [facility] REGION) [address] [place] [address] [phone]     Feb 21, 2024 8:00 PM (Arrive by 7:45 PM) POLYSOMNOGRAPHY STANDARD with SLEEP ROOM 4 [facility] SLEEP DISORDERS CENTER [facility] [address] [facility]) [address] [place] [address] [phone]     Mar 01, 2024 4:00 PM (Arrive by 3:45 PM) RETURN NEPHROLOGY with Abhijit Louanne Lawless, MD [facility] [place] [facility] REGION) [address] [facility] 1 through 4 [place] [address] [phone]     Mar 06, 2024 9:30 AM (Arrive by 9:15 AM) DEVICE CHECK with Murtis Delon Finder, ANP [facility] CARDIOLOGY [place]  [facility] REGION) [address] [facility] 1 through 4 [place] [address] [phone]     Mar 09, 2024 3:30 PM (Arrive by 3:15 PM) RETURN RHEUMATOLOGY with Beverley Sharps, MD [facility] RHEUMATOLOGY SPECIALTY [place] [facility] REGION) [address] [facility] 1 through 4 [place] [address] [phone]     May 02, 2024 1:35 PM (Arrive by 1:10 PM) RETURN NEUROLOGY with Josette LITTIE Clause, DO [facility] NEUROLOGY CLINIC [place] [facility] REGION) [address] [place] [address] [phone]     Jul 24, 2024 11:30 AM (Arrive by 11:05 AM) RETURN SLEEP with Manus Notch, PA [facility] NEUROLOGY CLINIC [place] [facility] REGION) [address] [place] [address] [phone]         I personally spent 70 minutes face-to-face and non-face-to-face in the care of this patient, which includes all pre, intra, and post visit time on the date of service.  All documented time was specific to the E/M visit and does not include any procedures that may have been performed.   Charmain Marker, NP Dept of Neurology / Epilepsy Division

## 2023-12-06 NOTE — Care Plan (Signed)
  Care Management Final Transition Planning Assessment    12/05/2023  Patient's Post Acute Contact Information: B[place], [place]   Home Phone: [phone]   Relation: Brother         Future Appointments  Date Time Provider Department Center  12/08/2023  2:00 PM Nola Leonel Bracket, MD [facility] [place]  12/28/2023 10:30 AM Lennie, Damien Augusta, AGNP [facility] [place]  01/09/2024 11:00 AM Rodino, Arlean Ip, CPP [facility] [place]  01/19/2024  2:20 PM Phipps, Barnie Arlean, MD [facility] [place]  02/21/2024  8:00 PM SLEEP ROOM 4 [facility] [place]  03/01/2024  4:00 PM Kshirsagar, Verdia Cullen, MD [facility] [place]  03/06/2024  9:30 AM Vannie Murtis Nest, ANP [facility]  03/09/2024  3:30 PM Claudene Righter, MD [facility] [place]  05/02/2024  1:35 PM Myra, Josette CROME, DO [facility] [place]  07/24/2024 11:30 AM Angelena Pastor, PA [facility] [place]                                     Transportation Anticipated: family or friend will provide    Currently receiving outpatient dialysis?: No       Discharge Disposition: Home w/ Self Care     IMM Delivery Follow Up Important Message (IM) letter given to patient and/or family?: Yes IMM Delivered by: The IMM was delivered and signed by patient IMM Delivery Date: 12/05/23 IMM Delivery Time: [phone]  Quality data for continuing care services shared with patient and/or representative?: N/A Patient and/or family were provided with choice of facilities / services that are available and appropriate to meet post hospital care needs?: N/A       Final Assessment Complete: Yes                     Readmission Risk Score:  Predictive Model Details        18% (Medium)  Factor Value   Calculated 12/05/2023 16:00 30% Number of active inpatient medication orders 42   [facility] Risk of Unplanned Readmission Model 12% Number of ED visits in last six months 2    *Archived Data 10% Number of hospitalizations in last year 2    10% ECG/EKG order present in last 6 months    8% Encounter of ten days or longer in last year present    7% Imaging order present in last 6 months    5% Current length of stay 4.902 days    5% Charlson Comorbidity Index 4    5% Active anticoagulant inpatient medication order present    4% Age 48    4% Diagnosis of renal failure present    2% Future appointment scheduled

## 2023-12-06 NOTE — Care Plan (Signed)
 Transition of Care Encounter Data   Call attempt: 1 Admission date: 11/30/23 Discharge date: 12/05/23 Discharge diagnosis: Seizure-like activity Do you have a hospital follow up appointment?: Yes - Within 14 Days, Yes with Primary PCP clinic (Comment: TCM scheduled follow up PCP appt via [employer] It with patient on call to confirm.) F/U Date: 12/08/23 F/U Provider: Nola Leonel Bracket, MD - [facility] [place] Patient post discharge: Are you able to make it to your f/u appt.?: Yes Since your discharge, are your symptoms better, worse, or the same?: Better Have you developed any new symptoms?: No Is there someone to help you at home?: Yes (Comment: Son)  Helper: Child  Are there questions we can help clarify before your next appointment?: No Medications:      Were you able to pick up all of your newly prescribed medications (and/or any necessary refills)?: No  Patient reported that he will pick up medications that was sent to [facility] today.  Were medications prescribed or ordered upon discharge reviewed today with the most recent outpatient medication list?: Yes Patient was reminded to bring all of their medication bottles to their follow up appointment: Yes If Home Health Services or Medical Equipment was ordered, has it arrived?: N/A Was the patients f/u appt. date/time and location confirmed with the patient?: Yes Remind Patients: If patient has a non-emergent medical problem, they may contact a nurse 24/7 or patient may call their provider's clinic. If experiencing a medical emergency, patient should call 911: Yes .   [facility]: [phone]:  .  [facility]: [phone]:  .  Other: Contact PCP:      Low priority, no response needed unless change in care.   I am reaching out to Ahmaad Marcell Saffo as part of the transitional case management team regarding his recent hospital discharge.  Patient advised to Review AVS for education related to  risk.SABRA   Next appt with PCP: 12/08/2023   See chart for med list if needed  Sharing communication as part of regulatory requirements.          Lucienne JINNY Pouch, RN

## 2023-12-08 NOTE — Progress Notes (Signed)
 [facility] Specialty Medication Onboarding    Specialty Medication: Kineret  Prior Authorization: Approved   Financial Assistance: No - copay  <$25  Copay/Day Supply: $12.15 / 10 days    Insurance Restrictions: None     Notes to Pharmacist:   Credit Card on File: no  Start Date on Rx:    Delivery Method (based on home address currently on file): UPS no restrictions      The triage team has completed the benefits investigation and has determined that the patient is able to fill this medication at [facility]. Please contact the patient to complete the onboarding or follow up with the prescribing physician as needed.

## 2023-12-15 NOTE — Progress Notes (Signed)
 I have reviewed and confirmed the Cardiomems readings, documentation and findings.     Caryl Ada, ANP-BC  [facility]  [address]  [place], [place] [address]  [phone]

## 2023-12-15 NOTE — Progress Notes (Signed)
 [facility] Note  Referring Provider: Pamella Barnie Dragon, MD [address] [facility] [place],  [place] [address]  Primary Provider: Pamella Barnie Dragon, MD [address] [facility] [place] [address]  Other Providers: Dr. Shirlyn Adjutant [facility] EP) Dr. Adina Lee [facility] EP)  Reason for Visit: Terry [name] is a 48 y.o. male being seen today 12/16/2023 for hospital follow up and continued care of his heart failure.  Assessment & Plan: 48 y.o. M with a PMH of NICM with HFimpEF (LVEF 50-55%, LVIDd 6.0, IVSd 1.3 in Jan 2025; s/p Raymond-ICD in Nov 2021), CKD5, Paroxysmal Afib (s/p Ablation x2 in Feb 2024 and Nov 2012, on Amio, on Eliquis ), DM2 (most recent A1c 8.8% in Jan 2025), NOCAD, h/o CVA who is referred to [facility] for consultative services regarding heart failure evaluation.  # NICM with HFimpEF: # HTN: -- NYHA II, ACC/AHA C, clinically euvolemic with normal filling pressures and good peripheral perfusion  -- Etiology is nonischemic; most likely due to uncontrolled HTN and progressive diabetic nephropathy; needs evaluation for sleep apnea -- Most recent TTE in May 2025: LVEF 45-50%, LVIDd 6.5; nadir LVEF 15-20% on index presentation in 2010 with recovery to LVEF 55-60% in Sept 2016 followed by progressive decline and then improvement as of Jan 2025 -- RHC in Nov 2024 with preserved CO at rest by indirect Fick, elevated left- and right-sided filling pressures with combined pre- and post-cap PH (mPA 48, mW 29, PVR 3.2) -- in Dec 2024: walked 195 total meters = 639.6 total feet -- Optimize GDMT: titration and optimization significantly limited by profound renal insufficiency.  - Continue Carvedilol  50 mg BID, continue 2 tablets of isosorbide -Hydralazine  20-37.5mg  TID  -- Volume mgmt: continue Torsemide  80mg  BID, and metolazone PRN (havn't used for the past 3 weeks). Will reassess with upcoming  visit.  -- S/p -ICD in Nov 2021 (Dr. Lee) -- s/p CardioMems (implant date: 07/21/2023). Today's PAD 26 mmHg, Target PAD 34 mmHg.  - labs collected today: K 4.5, Cr 6.04, pro-BNP 19, 592 (from 9,354). Contacted the patient and instructed him to take PRN metolazone and to transmit CardioMEMS readings daily. If his PAD remains above the target level, we will schedule an IV diuresis visit for the end of this week or early next week. Otherwise, he will keep his scheduled visit for repeat labs. The patient verbalized understanding and agreed with the plan.   # Paroxsymal Afib: -- S/p Ablation x2 in Feb 2024 and Nov 2012 -- Continue on Amio 200 daily - normal TSH and LFTs (07/2023) - no arrhythmia episodes since 10/07/2023 per ICD interrogation report on 12/05/23  -- Continue on Eliquis   # H/o Ischemic CVA (right ACA): -- Continue Eliquis   # CKD5: # DM2: -- Most recent A1c 8.8% in Jan 2025 -- Following with Nephrology and Endocrinology placed; now s/p surgical AVF placement  #Gout - uric acid level remains elevated; continue allopurinol  50mg  daily (from 50mg  on MWF) - Discharged with anakinra to use as needed for gout flares. He has not required any doses, as he has had no recent flares     # ? OSA - sleep study scheduled in 02/21/2024  # Seizure-like Activity ED visit in July 2025 due to episode of seizure-like activity. He was started on Keppra  during that visit. Interictal EEG was normal during recent hospitalization (11/30/23 - 12/05/23). He also had a negative EEG  previously. - Keppra  was discontinued during this hospitalization as his EEG has been normal.   - will follow up with Dr. [facility] neurology).     Follow-up: Return in about 12 days (around 12/28/2023).  As well as 03/05/24 With Dr. Drusilla.   History of Present Illness: Terry [name] is being seen at the request of Damien Lee, NP as I have been asked to provide consultation and evaluation of  his heart failure.   He is a 48 y.o. with an extensive hx of HFrEF.  He is here for establishing advance HF care. He reports he noticed fluid overloaded again after being discharged from Hospital on 11/21. Endorse SOB while walking, BL edema, tight things, abdomen distention. However, he can sleep lying flat with 2 pillows. Lower extremities edema is much better in the morning when he just wakes up. Endorse occasional orthopnea and PND. No chest pain/tightness/pressure. He is complaint with Torsemide  40mg  BID (discharge diuretic), his home weights are around 318lbs (he was 305lbs when he was discharged). He is not sure about his dry weight, but he states probably 305-308lbs. He has been limiting his oral intake to 1.5L since discharge. His home BP are 120s/80s. No presyncope or syncope. His appetite has been good, trying to follow low sodium diet.    Admit in Jan 2025 for ischemic CVA to R ACA territory following TEE+DCCV for Afib in Dec 2024.     Interval History: History of Present Illness Terry [name] presents for a hospital follow-up after recent hospitalization.  He reports no new concerns or changes in his activity level since his discharge. He remains active, walking daily for about an hour, covering approximately a mile and a half. No shortness of breath, chest pain, dizziness, or syncope. His home blood pressure readings are typically around 120/50 mmHg; however, it was higher during today's visit. He attributes this to not having taken his noon medications prior to the appointment.  He has experienced weight loss since starting Mounjaro, currently weighing between 270-275 pounds. No leg swelling or abdominal bloating. His sleep is generally good, though he occasionally wakes up during the night. He denies orthopnea or paroxysmal nocturnal dyspnea. He has a sleep study scheduled for January 20th, 2026.  Cardiovascular History & Procedures: 1.  Heart failure with reduced ejection fraction 2.  Coronary artery  disease, Arrhythmia, and Hypertension   Cath / PCI: RHC in Nov 2024: RA 14, RV 66/7, PA 67/28 (48), W 29 (v wave 41) Fick CO/CI 5.9/2.4, PA sat 60% PVR 3.22 WU LHC/RHC in Dec 2023: Mild nonobstructive disease of LAD and RCA Severe distal Lcx disease (not a PCI target) RA 6, RV 55/4, PA 68/15 (32), W 20 Fick CO/CI 6.1/2.5, PA sat 65%  CV Surgery:  None  EP Procedures and Devices: TEE+DCCV in Dec 2024 Afib Ablation in Feb 2024 (Dr. Lorean) Afib Ablation in Nov 2012 Costilla-ICD (Medtronic) in Nov 2021 (Dr. Lee)   Non-Invasive Evaluation(s): Echo: TTE in May 2025:  1. Left ventricular ejection fraction, by estimation, is 45 to 50%. The left ventricle has mildly decreased function. The left ventricle demonstrates global hypokinesis. The left ventricular internal cavity size was severely dilated. Left ventricular  diastolic parameters are consistent with Grade III diastolic dysfunction (restrictive). Elevated left atrial pressure.   2. Right ventricular systolic function is normal. The right ventricular size is mildly enlarged. Mildly increased right ventricular wall thickness. There is moderately elevated pulmonary artery systolic pressure.   3. Left atrial size was  mildly dilated.   4. Right atrial size was mildly dilated.   5. The mitral valve is normal in structure. Mild to moderate mitral valve regurgitation. No evidence of mitral stenosis.   6. Tricuspid valve regurgitation is mild to moderate.   7. The aortic valve is tricuspid. There is mild thickening of the aortic valve. Aortic valve regurgitation is not visualized. Aortic valve sclerosis is present, with no evidence of aortic valve stenosis.   8. Aortic dilatation noted. There is borderline dilatation of the aortic root, measuring 40 mm. There is borderline dilatation of the ascending aorta, measuring 36 mm.   9. The inferior vena cava is dilated in size with <50% respiratory variability, suggesting right atrial pressure of 15  mmHg.  TTE in Jan 2025:   1. The left ventricle is mildly dilated in size with mildly increased wall thickness.   2. The left ventricular systolic function is borderline, LVEF is visually estimated at 50-55%.   3. There is grade I diastolic dysfunction (impaired relaxation).   4. The right ventricle is normal in size, with normal systolic function.   5. Probably small residual interatrial communication, better visualized on recent transesophageal echocardiogram dated 01/27/2023. TTE in Nov 2024:  1. The left ventricle is moderately dilated in size with normal wall thickness.   2. The left ventricular systolic function is severely decreased, LVEF is visually estimated at 25-30%.   3. There is mild mitral valve regurgitation.   4. The right ventricle is mildly dilated in size, with moderately reduced systolic function.   5. IVC size and inspiratory change suggest elevated right atrial pressure. (10-20 mmHg). TTE in Dec 2023:   1. Technically difficult study.   2. The left ventricle is mildly dilated in size with mildly increased wall thickness.   3. The left ventricular systolic function is moderately to severely decreased, LVEF is visually estimated at 30-35%, decreased from prior echocardiogram dated 11/18/2021.   4. There is mild to moderate mitral valve regurgitation.   5. The right ventricle is mildly dilated in size, with mildly reduced systolic function.   6. IVC size and inspiratory change suggest elevated right atrial pressure. (10-20 mmHg). TTE in Oct 2023:   1. The left ventricle is normal in size with mildly increased wall thickness.   2. The left ventricular systolic function is moderately decreased, LVEF is visually estimated at 40%.   3. There is decreased contractile function involving the basal inferior segment.   4. Mitral annular calcification is present (mild).   5. The mitral valve leaflets are mildly thickened with normal leaflet mobility.   6. There is mild mitral valve  regurgitation.   7. The aortic valve is trileaflet with mildly thickened leaflets with mildly reduced excursion.   8. The left atrium is moderately dilated in size.   9. There is severe pulmonary hypertension.   10. The right ventricle is normal in size, with normal systolic function.   11. The right atrium is moderately dilated in size. TTE in Oct 2021:   1. Limited study to assess ventricular function.   2. The left ventricle is mildly to moderately dilated in size with mildly increased wall thickness.   3. The left ventricular systolic function is normal, LVEF is visually estimated at > 55%.  Noteably, there is significant ventricular ectopy present.   4. There is decreased contractile function involving the basal inferior segment(s).   5. The left atrium is mildly dilated in size.   6. The right  ventricle is normal in size.   7. The right atrium is mildly dilated  in size.   8. IVC size and inspiratory change suggest mildly elevated right atrial pressure. (5-10 mmHg). TTE in Dec 2018: Technically difficult study due to chest wall/lung interference Dilated left ventricle - mild Left ventricular hypertrophy - mild Borderline left ventricular systolic function, ejection fraction 50% Normal right ventricular systolic function Dilated left atrium - mild TTE in Sept 2016: Left ventricular hypertrophy - moderate Normal left ventricular systolic function, ejection fraction 55 to 60% Diastolic dysfunction - grade I (normal filling pressures) Dilated left atrium - mild Normal right ventricular systolic function Pericardial effusion - small  Cardiac CT/MRI/Nuclear Tests:  None   6 Minute Walk: 01/03/2023: walked 195 total meters = 639.6 total feet   Cardiopulmonary Stress Tests:  None  Other Past Medical History:  See below for the complete EPIC list of past medical and surgical history.    Current medications: Current Outpatient Medications on File Prior to Visit  Medication  Sig  . acetaminophen  (TYLENOL ) 325 MG tablet Take 2 tablets (650 mg total) by mouth daily as needed for pain.  . amiodarone  (PACERONE ) 200 MG tablet Take 1 tablet (200 mg total) by mouth daily.  SABRA amlodipine (NORVASC) 5 MG tablet Take 1 tablet (5 mg total) by mouth daily.  SABRA anakinra (KINERET) 100 mg/0.67 mL Syrg Inject the contents of 1 syringe (100 mg total) under the skin daily as needed (for 3-5 days with gout flares).  . apixaban  (ELIQUIS ) 5 mg Tab Take 1 tablet (5 mg total) by mouth two (2) times a day.  . atorvastatin  (LIPITOR) 40 MG tablet Take 1 tablet (40 mg total) by mouth daily. For cholesterol  . carvedilol  (COREG ) 25 MG tablet Take 2 tablets (50 mg total) by mouth two (2) times a day.  . colchicine (COLCRYS) 0.6 mg tablet Take 1/2 tablet (0.3mg ) by mouth at first sign of gout flare, then 1/2 tablet by mouth every three days until flare resolves.  . cyanocobalamin , vitamin B-12, (VITAMIN B-12) 1000 MCG tablet Take 1 tablet (1,000 mcg total) by mouth daily.  . insulin  aspart (NOVOLOG  FLEXPEN) 100 unit/mL (3 mL) injection pen Inject 4 units before each meal.  . insulin  glargine (BASAGLAR , LANTUS ) 100 unit/mL (3 mL) injection pen Inject 0.18 mL (18 Units total) under the skin nightly.  . insulin  syringe-needle U-100 1 mL 25 gauge x 5/8 Syrg   . isosorbide -hydrALAZINE  (BIDIL ) 20-37.5 mg per tablet Take 2 tablets by mouth Three (3) times a day.  . tirzepatide (MOUNJARO) 5 mg/0.5 mL PnIj pen Inject 0.5 mL (5 mg total) under the skin every seven (7) days.  . torsemide  (DEMADEX ) 20 MG tablet Take 4 tablets (80 mg total) by mouth two (2) times a day.  SABRA glucagon spray 3 mg/actuation Spry Use 1 spray in 1 nostril for severe hypoglycemia as directed by physician as per package instructions (Patient not taking: Reported on 12/16/2023)  . insulin  glargine (LANTUS ) 100 unit/mL injection Inject 0.18 mL (18 Units total) under the skin nightly.  . metOLazone (ZAROXOLYN) 2.5 MG tablet Take 1 tablet by  mouth as needed up to 3 times a week when instructed by cardiology clinic, supply to last 30 days. (Patient not taking: Reported on 12/16/2023)  . metOLazone (ZAROXOLYN) 2.5 MG tablet Take 1 tablet (2.5 mg total) by mouth two (2) times a day as needed (per cardiology).   No current facility-administered medications on file prior to visit.  Allergies: Patient has no known allergies.  Family History: The patient's family history includes Cancer in his mother; Kidney disease in his father; No Known Problems in his brother.  Social History: He  reports that he has never smoked. He has never been exposed to tobacco smoke. He has never used smokeless tobacco. He reports that he does not drink alcohol and does not use drugs.  Review of Systems: As per HPI.  Rest of the review of systems is negative or unremarkable except as stated above.  Physical Exam: VITAL SIGNS:  Vitals:   12/16/23 1255  BP: 147/68  Pulse: 73  Resp:   SpO2:        Wt Readings from Last 3 Encounters:  12/16/23 (!) 124.6 kg (274 lb 9.6 oz)  12/03/23 (!) 131.5 kg (289 lb 12.8 oz)  11/23/23 (!) 131.5 kg (289 lb 12.8 oz)      Today's Body mass index is 41.75 kg/m.   Height: 172.7 cm (5' 8)  CONSTITUTIONAL: well-appearing in no acute distress EYES: Conjunctivae and sclerae clear and anicteric. ENT: Benign.  CARDIOVASCULAR: JVP not seen above the clavicle with HOB at 90 degrees. Rate and rhythm are irregularly irregular.  PMI is laterally displaced.  There is no lifts or heaves.  Normal S1, S2. There is no murmur, gallops or rubs.  Radial and pedal pulses are 2+, bilaterally.   There is trace nonpitting edema, bilaterally. RUE fistula. RESPIRATORY: Normal respiratory effort. Clear to auscultation bilaterally..  There are no wheezes. GASTROINTESTINAL: Soft, non-tender, with audible bowel sounds. Abdomen nondistended.  Liver is nonpalpable. SKIN: No rashes, ecchymosis or petechiae.  Warm, well perfused.   MUSCULOSKELETAL:  no joint swelling  NEURO/PSYCH: Appropriate mood and affect. Alert and oriented to person, place, and time. No gross motor or sensory deficits evident.  Labs & Imaging: Reviewed in EPIC.  Office Visit on 12/16/2023  Component Date Value Ref Range Status  . PRO-BNP 12/16/2023 19,592.0 (H)  <=300.0 pg/mL Final  . Uric Acid 12/16/2023 13.8 (H)  3.7 - 9.2 mg/dL Final  . Sodium 88/85/7974 146 (H)  135 - 145 mmol/L Final  . Potassium 12/16/2023 4.5  3.4 - 4.8 mmol/L Final  . Chloride 12/16/2023 106  98 - 107 mmol/L Final  . CO2 12/16/2023 25.1  20.0 - 31.0 mmol/L Final  . Anion Gap 12/16/2023 15 (H)  5 - 14 mmol/L Final  . BUN 12/16/2023 75 (H)  9 - 23 mg/dL Final  . Creatinine 88/85/7974 6.04 (H)  0.73 - 1.18 mg/dL Final  . BUN/Creatinine Ratio 12/16/2023 12   Final  . eGFR CKD-EPI (2021) Male 12/16/2023 11 (L)  >=60 mL/min/1.26m2 Final  . Glucose 12/16/2023 105  70 - 179 mg/dL Final  . Calcium  12/16/2023 8.9  8.7 - 10.4 mg/dL Final  . Albumin  12/16/2023 3.2 (L)  3.4 - 5.0 g/dL Final  . Total Protein 12/16/2023 7.0  5.7 - 8.2 g/dL Final  . Total Bilirubin 12/16/2023 0.6  0.3 - 1.2 mg/dL Final  . AST 88/85/7974 10  <=34 U/L Final  . ALT 12/16/2023 <7 (L)  10 - 49 U/L Final  . Alkaline Phosphatase 12/16/2023 95  46 - 116 U/L Final   EKG: tracing reviewed; see final report - atrial flutter with variable block.  See official ECG report. Other pertinent records/tests were reviewed.  The following are further history from the patient's EPIC record for reference:  Past Medical History:  Diagnosis Date  . A-fib (CMS-HCC)    s/p ablation  .  AICD (automatic cardioverter/defibrillator) present   . CHF (congestive heart failure) (CMS-HCC)   . CVA (cerebral vascular accident)    (CMS-HCC) 02/03/2023  . Diabetes mellitus (CMS-HCC)   . Hypertension   . Morbid obesity (CMS-HCC)    Past Surgical History:  Procedure Laterality Date  . CARDIAC DEFIBRILLATOR PLACEMENT    . PR  ANASTOMOSIS,AV,ANY SITE Right 03/10/2023   Procedure: ARTERIOVENOUS ANASTOMOSIS, OPEN; DIRECT, ANY SITE, UPPER EXTREMITY;  Surgeon: Marchelle Kitchens, MD;  Location: [facility] OR [facility];  Service: General Surgery  . PR ANASTOMOSIS,AV,ANY SITE Right 10/24/2023   Procedure: ARTERIOVENOUS ANASTOMOSIS, OPEN; DIRECT, ANY SITE, UPPER EXTREMITY;  Surgeon: Marchelle Kitchens, MD;  Location: [facility] OR [facility];  Service: General Surgery  . PR AV ANAST,UP ARM BASILIC VEIN TRANSPOSIT Right 04/28/2023   Procedure: ARTERIOVENOUS ANASTOMOSIS, OPEN; BY UPPER ARM BASILIC VEIN TRANSPOSITION;  Surgeon: Marchelle Kitchens, MD;  Location: [facility] OR [facility];  Service: General Surgery  . PR CATH PLACE/CORON ANGIO, IMG SUPER/INTERP,R&L HRT CATH, L HRT VENTRIC N/A 01/11/2022   Procedure: Left/Right Heart Catheterization;  Surgeon: Eloy Fairy Rigg, MD;  Location: [facility] CATH;  Service: Cardiology  . PR COMPRE EP EVAL ABLTJ ATR FIB PULM VEIN ISOLATION N/A 03/05/2022   Procedure: Afib Ablation;  Surgeon: Gehi, Anil Kishin, MD;  Location: [facility] EP;  Service: Cardiology  . PR CYSTO/URETERO/PYELOSCOPY, CALCULUS TX Left 02/28/2018   Procedure: CYSTOURETHROSCOPY, W/URETEROSCOPY &/OR PYELOSCOPY; W/REMOVAL/MANIPULATION CALCULUS(URETERAL CATH INCLUDED);  Surgeon: Adine Alm Kays, MD;  Location: [facility] OR [facility];  Service: Urology  . PR CYSTOSCOPY,INSERT URETERAL STENT Left 02/07/2018   Procedure: CYSTOURETHROSCOPY,  WITH INSERTION OF INDWELLING URETERAL STENT (EG, GIBBONS OR DOUBLE-J TYPE). Estimated time 20 min.;  Surgeon: Adine Alm Kays, MD;  Location: [facility] OR [facility];  Service: Urology  . PR CYSTOSCOPY,INSERT URETERAL STENT Left 02/28/2018   Procedure: CYSTOURETHROSCOPY,  WITH INSERTION OF INDWELLING URETERAL STENT (EG, GIBBONS OR DOUBLE-J TYPE);  Surgeon: Adine Alm Kays, MD;  Location: [facility] OR [facility];  Service: Urology  . PR RIGHT HEART CATH O2 SATURATION & CARDIAC OUTPUT N/A 12/17/2022   Procedure: Right Heart  Catheterization;  Surgeon: Eloy Fairy Rigg, MD;  Location: [facility] Cath;  Service: Cardiology  . PR RMVL IMPLTBL DFB PLSE GEN W/REPL PLSE GEN 1 LEAD N/A 12/13/2019   Procedure: Remove & Replace ICD Single Generator;  Surgeon: Donnice Garnette Lee, MD;  Location: [facility] EP;  Service: Cardiology  . PR TCAT IMPL WRLS P-ART PRS SNR L-T HEMODYN MNTR N/A 07/21/2023   Procedure: Cardiomems with RHC;  Surgeon: Drusilla Elsie Lupita DOUGLAS, MD;  Location: [facility] CATH;  Service: Cardiology  . PR TREATMENT EXTENSIVE RETINOPATHY PHOTOCOAGULATION Left 07/06/2017   Procedure: TREATMENT OF EXTENSIVE OR PROGRESSIVE RETINOPATHY (EG, DIABETIC RETINOPATHY), PHOTOCOAGULATION;  Surgeon: Diona Mana, MD;  Location: [facility] OR [facility];  Service: Ophthalmology  . PR VITRECTOMY,PANRETINAL LASER RX Right 07/06/2017   Procedure: VITRECTOMY, MECHANICAL, PARS PLANA APPROACH; WITH ENDOLASER PANRETINAL PHOTOCOAGULATION;  Surgeon: Diona Mana, MD;  Location: [facility] OR [facility];  Service: Ophthalmology   ------------------------------------------------

## 2023-12-16 NOTE — Progress Notes (Signed)
 Cardiac Implanted Electronic Device Remote Monitoring Alert    Alert date: 12/16/2023    Remote Network: Medtronic    Reason for alert: Fluid Monitoring Alert: HF  Pt has cardiomemes      See PDF attached in media to this encounter when available for full episode details

## 2023-12-23 ENCOUNTER — Encounter: Payer: Self-pay | Admitting: Emergency Medicine

## 2023-12-23 ENCOUNTER — Emergency Department

## 2023-12-23 ENCOUNTER — Other Ambulatory Visit: Payer: Self-pay

## 2023-12-23 ENCOUNTER — Observation Stay: Admission: EM | Admit: 2023-12-23 | Discharge: 2023-12-25 | Disposition: A | Discharge status: Home / Self Care

## 2023-12-23 DIAGNOSIS — M50321 Other cervical disc degeneration at C4-C5 level: Secondary | ICD-10-CM | POA: Diagnosis not present

## 2023-12-23 DIAGNOSIS — I132 Hypertensive heart and chronic kidney disease with heart failure and with stage 5 chronic kidney disease, or end stage renal disease: Secondary | ICD-10-CM | POA: Diagnosis not present

## 2023-12-23 DIAGNOSIS — E1122 Type 2 diabetes mellitus with diabetic chronic kidney disease: Secondary | ICD-10-CM | POA: Diagnosis not present

## 2023-12-23 DIAGNOSIS — N185 Chronic kidney disease, stage 5: Secondary | ICD-10-CM | POA: Diagnosis not present

## 2023-12-23 DIAGNOSIS — R55 Syncope and collapse: Principal | ICD-10-CM | POA: Diagnosis present

## 2023-12-23 DIAGNOSIS — I5A Non-ischemic myocardial injury (non-traumatic): Secondary | ICD-10-CM

## 2023-12-23 DIAGNOSIS — I480 Paroxysmal atrial fibrillation: Secondary | ICD-10-CM

## 2023-12-23 DIAGNOSIS — I499 Cardiac arrhythmia, unspecified: Secondary | ICD-10-CM

## 2023-12-23 DIAGNOSIS — N179 Acute kidney failure, unspecified: Secondary | ICD-10-CM | POA: Diagnosis present

## 2023-12-23 DIAGNOSIS — I5022 Chronic systolic (congestive) heart failure: Secondary | ICD-10-CM | POA: Diagnosis not present

## 2023-12-23 DIAGNOSIS — Z794 Long term (current) use of insulin: Secondary | ICD-10-CM | POA: Insufficient documentation

## 2023-12-23 DIAGNOSIS — R634 Abnormal weight loss: Secondary | ICD-10-CM | POA: Insufficient documentation

## 2023-12-23 DIAGNOSIS — Z79899 Other long term (current) drug therapy: Secondary | ICD-10-CM | POA: Diagnosis not present

## 2023-12-23 DIAGNOSIS — I959 Hypotension, unspecified: Principal | ICD-10-CM | POA: Insufficient documentation

## 2023-12-23 DIAGNOSIS — Z8673 Personal history of transient ischemic attack (TIA), and cerebral infarction without residual deficits: Secondary | ICD-10-CM

## 2023-12-23 DIAGNOSIS — I951 Orthostatic hypotension: Secondary | ICD-10-CM | POA: Diagnosis present

## 2023-12-23 DIAGNOSIS — E8770 Fluid overload, unspecified: Secondary | ICD-10-CM

## 2023-12-23 DIAGNOSIS — Z8739 Personal history of other diseases of the musculoskeletal system and connective tissue: Secondary | ICD-10-CM

## 2023-12-23 DIAGNOSIS — Z7901 Long term (current) use of anticoagulants: Secondary | ICD-10-CM

## 2023-12-23 DIAGNOSIS — D638 Anemia in other chronic diseases classified elsewhere: Secondary | ICD-10-CM | POA: Insufficient documentation

## 2023-12-23 DIAGNOSIS — I472 Ventricular tachycardia, unspecified: Secondary | ICD-10-CM | POA: Diagnosis not present

## 2023-12-23 DIAGNOSIS — D631 Anemia in chronic kidney disease: Secondary | ICD-10-CM | POA: Diagnosis not present

## 2023-12-23 DIAGNOSIS — E1169 Type 2 diabetes mellitus with other specified complication: Secondary | ICD-10-CM

## 2023-12-23 HISTORY — DX: Atypical atrial flutter: I48.4

## 2023-12-23 HISTORY — DX: Disorder of arteries and arterioles, unspecified: I77.9

## 2023-12-23 HISTORY — DX: Cerebral infarction, unspecified: I63.9

## 2023-12-23 HISTORY — DX: Unspecified systolic (congestive) heart failure: I50.20

## 2023-12-23 HISTORY — DX: Morbid (severe) obesity due to excess calories: E66.01

## 2023-12-23 HISTORY — DX: Paroxysmal atrial fibrillation: I48.0

## 2023-12-23 HISTORY — DX: Chronic kidney disease, stage 5: N18.5

## 2023-12-23 HISTORY — DX: Gout, unspecified: M10.9

## 2023-12-23 HISTORY — DX: Other cardiomyopathies: I42.8

## 2023-12-23 LAB — COMPREHENSIVE METABOLIC PANEL WITH GFR
ALT: <5 U/L (ref 0–44)
AST: 13 U/L — ABNORMAL LOW (ref 15–41)
Albumin: 3.3 g/dL — ABNORMAL LOW (ref 3.5–5.0)
Alkaline Phosphatase: 87 U/L (ref 38–126)
Anion gap: 15 (ref 5–15)
BUN: 73 mg/dL — ABNORMAL HIGH (ref 6–20)
CO2: 21 mmol/L — ABNORMAL LOW (ref 22–32)
Calcium: 8 mg/dL — ABNORMAL LOW (ref 8.9–10.3)
Chloride: 103 mmol/L (ref 98–111)
Creatinine, Ser: 6.3 mg/dL — ABNORMAL HIGH (ref 0.61–1.24)
GFR, Estimated: 10 mL/min — ABNORMAL LOW (ref >60)
Glucose, Bld: 159 mg/dL — ABNORMAL HIGH (ref 70–99)
Potassium: 3.8 mmol/L (ref 3.5–5.1)
Sodium: 139 mmol/L (ref 135–145)
Total Bilirubin: 0.6 mg/dL (ref 0.0–1.2)
Total Protein: 6.3 g/dL — ABNORMAL LOW (ref 6.5–8.1)

## 2023-12-23 LAB — CBC WITH DIFFERENTIAL/PLATELET
Abs Immature Granulocytes: 0.01 K/uL (ref 0.00–0.07)
Basophils Absolute: 0 K/uL (ref 0.0–0.1)
Basophils Relative: 0 %
Eosinophils Absolute: 0.1 K/uL (ref 0.0–0.5)
Eosinophils Relative: 1 %
HCT: 26.6 % — ABNORMAL LOW (ref 39.0–52.0)
Hemoglobin: 8.4 g/dL — ABNORMAL LOW (ref 13.0–17.0)
Immature Granulocytes: 0 %
Lymphocytes Relative: 14 %
Lymphs Abs: 0.8 K/uL (ref 0.7–4.0)
MCH: 29.1 pg (ref 26.0–34.0)
MCHC: 31.6 g/dL (ref 30.0–36.0)
MCV: 92 fL (ref 80.0–100.0)
Monocytes Absolute: 0.8 K/uL (ref 0.1–1.0)
Monocytes Relative: 13 %
Neutro Abs: 4.1 K/uL (ref 1.7–7.7)
Neutrophils Relative %: 72 %
Platelets: 153 K/uL (ref 150–400)
RBC: 2.89 MIL/uL — ABNORMAL LOW (ref 4.22–5.81)
RDW: 12.9 % (ref 11.5–15.5)
WBC: 5.7 K/uL (ref 4.0–10.5)
nRBC: 0 % (ref 0.0–0.2)

## 2023-12-23 LAB — MAGNESIUM
Magnesium: 2.2 mg/dL (ref 1.7–2.4)
Magnesium: 2.1 mg/dL (ref 1.7–2.4)

## 2023-12-23 LAB — BLOOD GAS, VENOUS
Acid-base deficit: 1 mmol/L (ref 0.0–2.0)
Bicarbonate: 22.2 mmol/L (ref 20.0–28.0)
O2 Saturation: 87.8 %
Patient temperature: 37
pCO2, Ven: 32 mmHg — ABNORMAL LOW (ref 44–60)
pH, Ven: 7.45 — ABNORMAL HIGH (ref 7.25–7.43)
pO2, Ven: 54 mmHg — ABNORMAL HIGH (ref 32–45)

## 2023-12-23 LAB — BASIC METABOLIC PANEL WITH GFR
Anion gap: 12 (ref 5–15)
BUN: 77 mg/dL — ABNORMAL HIGH (ref 6–20)
CO2: 24 mmol/L (ref 22–32)
Calcium: 7.9 mg/dL — ABNORMAL LOW (ref 8.9–10.3)
Chloride: 106 mmol/L (ref 98–111)
Creatinine, Ser: 6.03 mg/dL — ABNORMAL HIGH (ref 0.61–1.24)
GFR, Estimated: 11 mL/min — ABNORMAL LOW (ref >60)
Glucose, Bld: 101 mg/dL — ABNORMAL HIGH (ref 70–99)
Potassium: 3.4 mmol/L — ABNORMAL LOW (ref 3.5–5.1)
Sodium: 142 mmol/L (ref 135–145)

## 2023-12-23 LAB — TROPONIN T, HIGH SENSITIVITY
Troponin T High Sensitivity: 211 ng/L (ref 0–19)
Troponin T High Sensitivity: 204 ng/L (ref 0–19)

## 2023-12-23 LAB — PRO BRAIN NATRIURETIC PEPTIDE: Pro Brain Natriuretic Peptide: 14693 pg/mL — ABNORMAL HIGH (ref <300.0)

## 2023-12-23 MED ORDER — INSULIN ASPART 100 UNIT/ML ~~LOC~~ SOLN: 5.0000 [IU] | Freq: Three times a day (TID) | SUBCUTANEOUS | Status: DC

## 2023-12-23 MED ORDER — ACETAMINOPHEN 650 MG RE SUPP: 650.0000 mg | Freq: Four times a day (QID) | RECTAL | Status: DC | PRN

## 2023-12-23 MED ORDER — APIXABAN 2.5 MG PO TABS
2.5000 mg | ORAL_TABLET | Freq: Two times a day (BID) | ORAL | Status: DC
  Administered 2023-12-23 – 2023-12-25 (×4): 2.5 mg via ORAL
  Filled 2023-12-23 (×5): qty 1

## 2023-12-23 MED ORDER — INSULIN GLARGINE-YFGN 100 UNIT/ML ~~LOC~~ SOLN
8.0000 [IU] | Freq: Every day | SUBCUTANEOUS | Status: DC
  Administered 2023-12-23: 8 [IU] via SUBCUTANEOUS
  Filled 2023-12-23 (×2): qty 0.08

## 2023-12-23 MED ORDER — ACETAMINOPHEN 325 MG PO TABS: 650.0000 mg | ORAL_TABLET | Freq: Four times a day (QID) | ORAL | Status: DC | PRN

## 2023-12-23 MED ORDER — CARVEDILOL 25 MG PO TABS
25.0000 mg | ORAL_TABLET | Freq: Two times a day (BID) | ORAL | Status: DC
  Administered 2023-12-23 – 2023-12-25 (×4): 25 mg via ORAL
  Filled 2023-12-23 (×4): qty 1

## 2023-12-23 MED ORDER — OXYCODONE HCL 5 MG PO TABS: 5.0000 mg | ORAL_TABLET | Freq: Four times a day (QID) | ORAL | Status: DC | PRN

## 2023-12-23 MED ORDER — ISOSORB DINITRATE-HYDRALAZINE 20-37.5 MG PO TABS
1.0000 | ORAL_TABLET | Freq: Three times a day (TID) | ORAL | Status: DC
  Administered 2023-12-23 – 2023-12-24 (×2): 1 via ORAL
  Filled 2023-12-23 (×4): qty 1

## 2023-12-23 MED ORDER — CARVEDILOL 6.25 MG PO TABS
12.5000 mg | ORAL_TABLET | Freq: Two times a day (BID) | ORAL | Status: DC
  Filled 2023-12-23: qty 2

## 2023-12-23 MED ORDER — INSULIN ASPART 100 UNIT/ML IJ SOLN
5.0000 [IU] | Freq: Three times a day (TID) | INTRAMUSCULAR | Status: DC
  Administered 2023-12-24 (×2): 5 [IU] via SUBCUTANEOUS
  Filled 2023-12-23 (×2): qty 5

## 2023-12-23 MED ORDER — AMIODARONE HCL 200 MG PO TABS
200.0000 mg | ORAL_TABLET | Freq: Every day | ORAL | Status: DC
  Administered 2023-12-24 – 2023-12-25 (×2): 200 mg via ORAL
  Filled 2023-12-23 (×2): qty 1

## 2023-12-23 MED ORDER — TORSEMIDE 20 MG PO TABS
100.0000 mg | ORAL_TABLET | Freq: Every day | ORAL | Status: DC
  Administered 2023-12-24: 100 mg via ORAL
  Filled 2023-12-23: qty 5

## 2023-12-23 MED ORDER — ATORVASTATIN CALCIUM 20 MG PO TABS
40.0000 mg | ORAL_TABLET | Freq: Every day | ORAL | Status: DC
  Administered 2023-12-24 – 2023-12-25 (×2): 40 mg via ORAL
  Filled 2023-12-23 (×2): qty 2

## 2023-12-23 NOTE — Progress Notes (Signed)
       CROSS COVER NOTE  NAME: Terry [name] MRN: [id number] DOB : Jul 11, 1975    Concern as stated by nurse / staff   Hi Dr. Cleatus patient came in d/t syncope. Tele just called and he had a 25 beat run of V-tach. Patient not symptomatic. Has a hx of CHF, afib with an AICD.      Pertinent findings on chart review: Admitted earlier for syncope.  Coreg  was reduced from 50 mg to 12.5 due to concerns for orthostatic hypotension   Patient Assessment    12/23/2023    7:28 PM 12/23/2023    6:42 PM 12/23/2023    5:00 PM  Vitals with BMI  Systolic 161 171 855  Diastolic 84 89 84  Pulse 80 79 70  Physical Exam Vitals and nursing note reviewed.  Constitutional:      General: He is not in acute distress.    Comments: Patient awake and alert and appears comfortable.  Denying complaints  HENT:     Head: Normocephalic and atraumatic.  Cardiovascular:     Rate and Rhythm: Normal rate and regular rhythm.     Heart sounds: Normal heart sounds.  Pulmonary:     Effort: Pulmonary effort is normal.     Breath sounds: Normal breath sounds.  Abdominal:     Palpations: Abdomen is soft.     Tenderness: There is no abdominal tenderness.  Neurological:     Mental Status: Mental status is at baseline.       workup    Latest Ref Rng & Units 12/23/2023    9:44 PM 12/23/2023    1:00 PM 08/20/2023    8:27 AM  BMP  Glucose 70 - 99 mg/dL 898  840  809   BUN 6 - 20 mg/dL 77  73  90   Creatinine 0.61 - 1.24 mg/dL 3.96  3.69  4.14   Sodium 135 - 145 mmol/L 142  139  142   Potassium 3.5 - 5.1 mmol/L 3.4  3.8  3.9   Chloride 98 - 111 mmol/L 106  103  105   CO2 22 - 32 mmol/L 24  21  24    Calcium  8.9 - 10.3 mg/dL 7.9  8.0  8.3    Magnesium 2.1   Assessment and  Interventions   Assessment:  Sustained V tach, asymptomatic without AICD firing  Plan: Will repeat potassium and mag and corrected potassium over 4 and magnesium over 2 Will increase Coreg  (decreased on admission).  Continue to  hold BiDil /amlodipine Continue other management  K 3.4 so will replete      CRITICAL CARE Performed by: Delayne LULLA Cleatus   Total critical care time: 30 minutes  Critical care time was exclusive of separately billable procedures and treating other patients.  Critical care was necessary to treat or prevent imminent or life-threatening deterioration.  Critical care was time spent personally by me on the following activities: development of treatment plan with patient and/or surrogate as well as nursing, discussions with consultants, evaluation of patient's response to treatment, examination of patient, obtaining history from patient or surrogate, ordering and performing treatments and interventions, ordering and review of laboratory studies, ordering and review of radiographic studies, pulse oximetry and re-evaluation of patient's condition.

## 2023-12-23 NOTE — H&P (Signed)
 HISTORY AND PHYSICAL    Terry [name]   FMW:[id number] DOB: May 27, 1975   Date of Service: 12/23/23 Requesting physician/APP from ED: Treatment Team:  Attending Provider: Jataya Wann, DO  PCP: Pcp, No   Terry [name] is a 48 y.o. 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 Presents to the ED w/ syncope and LOC.   HPI: Having syncopal / seizure like episodes - today brough to ED when he lost consciousness at home, he remembers having bowel movement, goin gto stand up and then loss consciousness, apparently was unresponsive eyes open but not answering his son, felt sweaty afterward no confusion. Similar episodes few times w/ extensive neuro workup and seizure activity has been ruled out. Following w/ cardiology. Has not felt his AICD shock him.   In ED, Pro-BNP elevated but less than Pro-BNP from about a week ago at his cardiologist, few runs of asymptomatic VT on telemtry. EDP here spoke w/ his cardiology fellow at [facility] who recommended obs overnight and interrogate the pacemaker. Hospitalist was consulted for admission    Consultants:  none  Procedures: none      ASSESSMENT & PLAN:   Syncope C/w vasovagal / orthostatic Telemetry overnight Orthostatic VS Reduced some cardiac meds - lowered dose beta blocker and isosorbide -hydralazine , holding amlodipine   HFpEF HTN cardiomyopathy  Pt does not appear fluid overloaded, and BNP / troponin elevated but w/ CKD5/ESRD and underlying cardiomyopathy this is expected but will repeat Echo anyway given syncopal episodes recently  Lowered dose beta blocker as above  Continue torsemide , Eliquis , statin  Trend Pro-BNP  Elevated troponin flat and down trending  No chest pain Troponin up in CKD5/ESRD and demand ischemia Telemetry  Trend  another troponin overnight  EKG repeat as needed   Paroxysmal Afib Continue amiodarone  Lowered beta blocker w/ concern for orthostatic syncope  Despite fall risk pt wants to stay on Eliquis  given CVA hx, will lower dose for now   Hx CVA Statin, eliquis , risk factor modification   DM2 Continue home insulin    Gout Continue allopurinol        DVT prophylaxis: eliquis  Pertinent IV fluids/nutrition: no fluids, renal diet  Central lines / invasive devices: noen  Code Status: FULL CODE Family Communication: none at this time  Disposition: observation TOC needs: TBD  Barriers to discharge / significant pending items: echo, orthostatic VS, telemetry, BP on reduced meds                   Review of Systems:  Review of Systems  Constitutional:  Negative for chills, fever and malaise/fatigue.  Respiratory:  Negative for cough, sputum production and shortness of breath.   Cardiovascular:  Negative for chest pain, palpitations, orthopnea and leg swelling.  Gastrointestinal:  Negative for abdominal pain, constipation, diarrhea, heartburn, nausea and vomiting.  Genitourinary:  Negative for dysuria and urgency.  Neurological:  Positive for loss of consciousness (as per HPI). Negative for dizziness, speech change, focal weakness and seizures.  Psychiatric/Behavioral:  Negative for depression. The patient does not have insomnia.        has a past medical history of AICD (automatic cardioverter/defibrillator) present, CHF (congestive heart failure) (HCC), Coronary artery disease, Diabetes mellitus without complication (HCC), Dysrhythmia, Palpitations, Presence of permanent cardiac pacemaker, and Renal disorder.  No current facility-administered medications on file prior to encounter.  Current Outpatient Medications on File Prior to Encounter  Medication Sig Dispense Refill   acetaminophen  (TYLENOL ) 325 MG tablet Take 650 mg by mouth daily as needed.     allopurinol   (ZYLOPRIM ) 100 MG tablet Take 50 mg by mouth 2 (two) times a week.     amiodarone  (PACERONE ) 200 MG tablet Take 1 tablet by mouth daily.     amLODipine (NORVASC) 5 MG tablet Take 5 mg by mouth daily.     anakinra (KINERET) 100 MG/0.67ML SOSY injection Inject 100 mg into the skin.  Inject the contents of 1 syringe (100 mg total) under the skin daily as needed (for 3-5 days with gout flares).     apixaban  (ELIQUIS ) 5 MG TABS tablet Take 5 mg by mouth 2 (two) times daily.     atorvastatin  (LIPITOR) 40 MG tablet Take 40 mg by mouth daily.     carvedilol  (COREG ) 25 MG tablet Take 50 mg by mouth 2 (two) times daily with a meal.     cyanocobalamin  1000 MCG tablet Take 1 tablet (1,000 mcg total) by mouth daily. 30 tablet 0   insulin  aspart (NOVOLOG ) 100 UNIT/ML injection Inject 5 Units into the skin 3 (three) times daily with meals. 4.5 mL 0   isosorbide -hydrALAZINE  (BIDIL ) 20-37.5 MG tablet Take 2 tablets by mouth 3 (three) times daily.     LANTUS  SOLOSTAR 100 UNIT/ML Solostar Pen Inject 8 Units into the skin at bedtime.     metolazone (ZAROXOLYN) 2.5 MG tablet Take 2.5 mg by mouth 3 (three) times a week.     MOUNJARO 5 MG/0.5ML Pen Inject 5 mg into the skin once a week.     oxyCODONE  (OXY IR/ROXICODONE ) 5 MG immediate release tablet Take by mouth.     torsemide  (DEMADEX ) 100 MG tablet Take 1 tablet by mouth daily.     blood glucose meter kit and supplies KIT Dispense based on patient and insurance preference. Use up to four times daily as directed. (FOR ICD-9 250.00, 250.01). 1 each 0   hydrALAZINE  (APRESOLINE ) 50 MG tablet Take 50 mg by mouth 3 (three) times daily. (Patient not taking: Reported on 12/23/2023)     Insulin  Syringe-Needle U-100 25G X 5/8 1 ML MISC Needs enough insulin  needles & syringes for insulin  aspart 5 units TID w/ meals & insulin  NPH 20 units daily for 30 days 1 each 0   ondansetron  (ZOFRAN -ODT) 8 MG disintegrating tablet Take 1 tablet (8 mg total) by mouth every 8 (eight) hours as  needed for nausea or vomiting. (Patient not taking: Reported on 12/23/2023) 20 tablet 0    reports that he has never smoked. He has never used smokeless tobacco. He reports that he does not drink alcohol and does not use drugs.   No Known Allergies    family history includes Diabetes Mellitus II in his father; Heart disease in his father; Hypertension in his father; Ovarian cancer in his mother. Past Surgical History:  Procedure Laterality Date   ABLATION     CARDIAC   CARDIAC DEFIBRILLATOR PLACEMENT     CATARACT EXTRACTION W/PHACO Right 02/15/2017   Procedure: CATARACT EXTRACTION PHACO AND INTRAOCULAR LENS PLACEMENT (IOC);  Surgeon: Jaye Fallow, MD;  Location: [facility];  Service: Ophthalmology;  Laterality: Right;  US  00:48 AP% 14.8 CDE 7.11 Fluid pack lot # [id number]   CATARACT EXTRACTION W/PHACO Left 03/15/2017   Procedure: CATARACT EXTRACTION PHACO AND INTRAOCULAR LENS PLACEMENT (IOC);  Surgeon: Jaye Fallow, MD;  Location: [facility];  Service: Ophthalmology;  Laterality: Left;  US  00:46.0 AP% 11.2 CDE 5.15 FLUID PACK LOT # [id number]   CORONARY ANGIOPLASTY     INSERT / REPLACE / REMOVE PACEMAKER            Objective Findings:  Vitals:   12/23/23 1250 12/23/23 1253 12/23/23 1700  BP:  116/77 (!) 144/84  Pulse:  72 70  Resp:  16 16  Temp:  98 F (36.7 C)   TempSrc:  Oral   SpO2:  99% 98%  Weight: 120.2 kg    Height: 5' 8 (1.727 m)     No intake or output data in the 24 hours ending 12/23/23 1741 Filed Weights   12/23/23 1250  Weight: 120.2 kg    Examination:  Physical Exam Constitutional:      General: He is not in acute distress.    Appearance: He is not ill-appearing.  Cardiovascular:     Rate and Rhythm: Normal rate and regular rhythm.     Heart sounds: Normal heart sounds.  Pulmonary:     Effort: Pulmonary effort is normal.     Breath sounds: Normal breath sounds. No rales.  Abdominal:     General: Bowel sounds are normal. There is no  distension.  Musculoskeletal:     Right lower leg: Edema (trace - 1+) present.     Left lower leg: Edema (trace - 1+) present.  Skin:    General: Skin is warm and dry.  Neurological:     Mental Status: He is alert and oriented to person, place, and time. Mental status is at baseline.  Psychiatric:        Mood and Affect: Mood normal.        Behavior: Behavior normal.         Antimicrobials:  Anti-infectives (From admission, onward)    None           Data Reviewed: I have personally reviewed following labs and imaging studies  CBC: Recent Labs  Lab 12/23/23 1300  WBC 5.7  NEUTROABS 4.1  HGB 8.4*  HCT 26.6*  MCV 92.0  PLT 153   Basic Metabolic Panel: Recent Labs  Lab 12/23/23 1300  NA 139  K 3.8  CL 103  CO2 21*  GLUCOSE 159*  BUN 73*  CREATININE 6.30*  CALCIUM  8.0*  MG 2.2   GFR: Estimated Creatinine Clearance: 17.7 mL/min (A) (by C-G formula based on SCr of 6.3 mg/dL (H)). Liver Function Tests: Recent Labs  Lab 12/23/23 1300  AST 13*  ALT <5  ALKPHOS 87  BILITOT 0.6  PROT 6.3*  ALBUMIN  3.3*   No results for input(s): LIPASE, AMYLASE in the last 168 hours. No results for input(s): AMMONIA in the last 168 hours. Coagulation Profile: No results for input(s): INR, PROTIME in the last 168 hours. Cardiac Enzymes: No results for input(s): CKTOTAL, CKMB, CKMBINDEX, TROPONINI in the last 168 hours. BNP (last 3 results) Recent Labs    12/23/23 1300  PROBNP 14,693.0*   HbA1C: No results for input(s): HGBA1C in the last 72 hours. CBG: No results for input(s): GLUCAP in the last 168 hours. Lipid Profile: No results for input(s): CHOL, HDL, LDLCALC, TRIG, CHOLHDL, LDLDIRECT in the last 72 hours. Thyroid  Function Tests: No results for input(s): TSH, T4TOTAL, FREET4, T3FREE, THYROIDAB in the last 72 hours. Anemia Panel: No results for input(s): VITAMINB12, FOLATE, FERRITIN, TIBC, IRON,  RETICCTPCT in the last 72 hours. Most Recent Urinalysis On File:     Component Value Date/Time  COLORURINE YELLOW (A) 08/29/2019 0900   APPEARANCEUR CLEAR (A) 08/29/2019 0900   LABSPEC 1.013 08/29/2019 0900   PHURINE 5.0 08/29/2019 0900   GLUCOSEU >=500 (A) 08/29/2019 0900   HGBUR NEGATIVE 08/29/2019 0900   BILIRUBINUR NEGATIVE 08/29/2019 0900   KETONESUR NEGATIVE 08/29/2019 0900   PROTEINUR NEGATIVE 08/29/2019 0900   NITRITE NEGATIVE 08/29/2019 0900   LEUKOCYTESUR NEGATIVE 08/29/2019 0900   Sepsis Labs: @LABRCNTIP (procalcitonin:4,lacticidven:4)  No results found for this or any previous visit (from the past 240 hours).       Radiology Studies: DG Shoulder Right Result Date: 12/23/2023 EXAM: 1 VIEW(S) XRAY OF THE RIGHT SHOULDER 12/23/2023 01:54:00 PM COMPARISON: None available. CLINICAL HISTORY: fall, pain FINDINGS: BONES AND JOINTS: Glenohumeral joint is normally aligned. No acute fracture or dislocation. Mild degenerative changes of the acromioclavicular joint. SOFT TISSUES: Surgical clips in upper arm. No abnormal calcifications. Visualized lung is unremarkable. IMPRESSION: 1. No acute fracture or dislocation. 2. Mild acromioclavicular joint degenerative changes. Electronically signed by: Waddell Calk MD 12/23/2023 02:41 PM EST RP Workstation: [id number]   DG Chest 2 View Result Date: 12/23/2023 EXAM: 2 VIEW(S) XRAY OF THE CHEST 12/23/2023 01:54:00 PM COMPARISON: 06/15/2023 CLINICAL HISTORY: fall, pain FINDINGS: LINES, TUBES AND DEVICES: Left chest single lead AICD in place. LUNGS AND PLEURA: No focal pulmonary opacity. No pleural effusion. No pneumothorax. HEART AND MEDIASTINUM: Mild cardiomegaly. BONES AND SOFT TISSUES: No acute fracture or destructive lesion. Multilevel thoracic osteophytosis. IMPRESSION: 1. No acute cardiopulmonary process. 2. Mild cardiomegaly. Electronically signed by: Waddell Calk MD 12/23/2023 02:39 PM EST RP Workstation: [id number]   CT Head Wo  Contrast Result Date: 12/23/2023 EXAM: CT HEAD AND CERVICAL SPINE 12/23/2023 01:09:20 PM TECHNIQUE: CT of the head and cervical spine was performed without the administration of intravenous contrast. Multiplanar reformatted images are provided for review. Automated exposure control, iterative reconstruction, and/or weight based adjustment of the mA/kV was utilized to reduce the radiation dose to as low as reasonably achievable. COMPARISON: None available. CLINICAL HISTORY: syncope/fall on eliquis  FINDINGS: CT HEAD BRAIN AND VENTRICLES: No acute intracranial hemorrhage. No mass effect or midline shift. No abnormal extra-axial fluid collection. No evidence of acute infarct. No hydrocephalus. ORBITS: No acute abnormality. SINUSES AND MASTOIDS: No acute abnormality. SOFT TISSUES AND SKULL: No acute skull fracture. No acute soft tissue abnormality. CT CERVICAL SPINE BONES AND ALIGNMENT: No acute fracture or traumatic malalignment. DEGENERATIVE CHANGES: C4-C5 degenerative disc with disease height loss, endplate sclerosis and spurring, and schmorl's nodes. SOFT TISSUES: No prevertebral soft tissue swelling. IMPRESSION: 1. No acute abnormality intracranially or in the cervical spine. 2. C4-C5 degenerative disc disease. Electronically signed by: Gilmore Molt MD 12/23/2023 01:25 PM EST RP Workstation: [id number]   CT Cervical Spine Wo Contrast Result Date: 12/23/2023 EXAM: CT HEAD AND CERVICAL SPINE 12/23/2023 01:09:20 PM TECHNIQUE: CT of the head and cervical spine was performed without the administration of intravenous contrast. Multiplanar reformatted images are provided for review. Automated exposure control, iterative reconstruction, and/or weight based adjustment of the mA/kV was utilized to reduce the radiation dose to as low as reasonably achievable. COMPARISON: None available. CLINICAL HISTORY: syncope/fall on eliquis  FINDINGS: CT HEAD BRAIN AND VENTRICLES: No acute intracranial hemorrhage. No mass effect or  midline shift. No abnormal extra-axial fluid collection. No evidence of acute infarct. No hydrocephalus. ORBITS: No acute abnormality. SINUSES AND MASTOIDS: No acute abnormality. SOFT TISSUES AND SKULL: No acute skull fracture. No acute soft tissue abnormality. CT CERVICAL SPINE BONES AND ALIGNMENT: No acute fracture or traumatic malalignment. DEGENERATIVE CHANGES:  C4-C5 degenerative disc with disease height loss, endplate sclerosis and spurring, and schmorl's nodes. SOFT TISSUES: No prevertebral soft tissue swelling. IMPRESSION: 1. No acute abnormality intracranially or in the cervical spine. 2. C4-C5 degenerative disc disease. Electronically signed by: Gilmore Molt MD 12/23/2023 01:25 PM EST RP Workstation: [id number]             LOS: 0 days       Laneta Blunt, DO [facility] 12/23/2023, 5:41 PM    Dictation software may have been used to generate the above note. Typos may occur and escape review in typed/dictated notes. Please contact Dr Blunt directly for clarity if needed.  Staff may message me via secure chat in Epic  but this may not receive an immediate response,  please page me for urgent matters!  If 7PM-7AM, please contact night coverage [email or web address]

## 2023-12-23 NOTE — ED Provider Notes (Signed)
 [facility] Provider Note    Event Date/Time   First MD Initiated Contact with Patient 12/23/23 1237     (approximate)   History   Loss of Consciousness   HPI  Terry [name] is a 47 y.o. male with a PMH of NICM with HFimpEF (LVEF 50-55%, LVIDd 6.0, IVSd 1.3 in Jan 2025; s/p [employer]is-ICD in Nov 2021), CKD5, Paroxysmal Afib (s/p Ablation x2 in Feb 2024 and Nov 2012, on Amio, on Eliquis ), DM2 (most recent A1c 8.8% in Jan 2025), NOCAD, h/o CVA who presents for syncopal episode.  Patient was using the bathroom today and had a bowel movement then lost consciousness and fell on his right side.  He is on Eliquis  and did hit his head.  He endorses right chest pain.  He reports a history of multiple similar episodes previously.  Denies any headache, chest pain shortness of breath abdominal pain.  Denies any confusion.  He has a fistula in place in his right upper extremity however he has not started dialysis yet.  He has a recent admission for similar 10/29--11/3 at [facility]; admitted to neurology service with cardiology consulting and neurology does not think these are consistent with epileptic events.  He has followed up with the heart failure clinic afterwards.  Of note, patient has not felt his AICD go off      Physical Exam   Triage Vital Signs: ED Triage Vitals  Encounter Vitals Group     BP      Girls Systolic BP Percentile      Girls Diastolic BP Percentile      Boys Systolic BP Percentile      Boys Diastolic BP Percentile      Pulse      Resp      Temp      Temp src      SpO2      Weight      Height      Head Circumference      Peak Flow      Pain Score      Pain Loc      Pain Education      Exclude from Growth Chart     Most recent vital signs: Vitals:   12/23/23 1253 12/23/23 1700  BP: 116/77 (!) 144/84  Pulse: 72 70  Resp: 16 16  Temp: 98 F (36.7 C)   SpO2: 99% 98%    Nursing Triage Note reviewed. Vital signs reviewed and patients oxygen  saturation is normoxic  General: Patient is well nourished, well developed, awake and alert, Head: Normocephalic and atraumatic Eyes: Normal inspection, extraocular muscles intact, no conjunctival pallor Ear, nose, throat: Normal external exam Neck: Normal range of motion Respiratory: Patient is in no respiratory distress, lungs with mild rales Cardiovascular: Patient is not tachycardic, RR Patient has a AV fistula in the right upper extremity with palpable thrill GI: Abd SNT with no guarding or rebound  Back: Normal inspection of the back with good strength and range of motion throughout all ext Extremities: pulses intact with good cap refills, 2+ lower extremity edema without erythema Patient has right shoulder tenderness to palpation but full range of motion Neuro: The patient is alert and oriented to person, place, and time, appropriately conversive, with 5/5 bilat UE/LE strength, no gross motor or sensory defects noted. Coordination appears to be adequate. Skin: Warm, dry, and intact Psych: normal mood and affect, no SI or HI  ED Results / Procedures /  Treatments   Labs (all labs ordered are listed, but only abnormal results are displayed) Labs Reviewed  CBC WITH DIFFERENTIAL/PLATELET - Abnormal; Notable for the following components:      Result Value   RBC 2.89 (*)    Hemoglobin 8.4 (*)    HCT 26.6 (*)    All other components within normal limits  COMPREHENSIVE METABOLIC PANEL WITH GFR - Abnormal; Notable for the following components:   CO2 21 (*)    Glucose, Bld 159 (*)    BUN 73 (*)    Creatinine, Ser 6.30 (*)    Calcium  8.0 (*)    Total Protein 6.3 (*)    Albumin  3.3 (*)    AST 13 (*)    GFR, Estimated 10 (*)    All other components within normal limits  PRO BRAIN NATRIURETIC PEPTIDE - Abnormal; Notable for the following components:   Pro Brain Natriuretic Peptide 14,693.0 (*)    All other components within normal limits  BLOOD GAS, VENOUS - Abnormal; Notable for  the following components:   pH, Ven 7.45 (*)    pCO2, Ven 32 (*)    pO2, Ven 54 (*)    All other components within normal limits  TROPONIN T, HIGH SENSITIVITY - Abnormal; Notable for the following components:   Troponin T High Sensitivity 211 (*)    All other components within normal limits  TROPONIN T, HIGH SENSITIVITY - Abnormal; Notable for the following components:   Troponin T High Sensitivity 204 (*)    All other components within normal limits  MAGNESIUM  BASIC METABOLIC PANEL WITH GFR     EKG EKG and rhythm strip are interpreted by myself:   EKG: [Normal sinus rhythm] at heart rate of , normal QRS duration, QTc 72, nonspecific ST segments and T waves no ectopy EKG not consistent with Acute STEMI Rhythm strip: 486 in lead II   RADIOLOGY CT head: No intracranial hemorrhage on my independent review interpretation and radiologist agrees CT C-spine: No acute abnormality:  Chest x-ray: No acute abnormality on my independent review interpretation and radiologist agrees Right shoulder x-ray: No acute abnormality   PROCEDURES:  Critical Care performed: No  Procedures   MEDICATIONS ORDERED IN ED: Medications  acetaminophen  (TYLENOL ) tablet 650 mg (has no administration in time range)    Or  acetaminophen  (TYLENOL ) suppository 650 mg (has no administration in time range)  amiodarone  (PACERONE ) tablet 200 mg (has no administration in time range)  apixaban  (ELIQUIS ) tablet 2.5 mg (has no administration in time range)  atorvastatin  (LIPITOR) tablet 40 mg (has no administration in time range)  carvedilol  (COREG ) tablet 12.5 mg (has no administration in time range)  isosorbide -hydrALAZINE  (BIDIL ) 20-37.5 MG per tablet 1 tablet (has no administration in time range)  insulin  glargine-yfgn (SEMGLEE ) injection 8 Units (has no administration in time range)  oxyCODONE  (Oxy IR/ROXICODONE ) immediate release tablet 5 mg (has no administration in time range)  torsemide  (DEMADEX )  tablet 100 mg (has no administration in time range)  insulin  aspart (novoLOG ) injection 5 Units (has no administration in time range)     IMPRESSION / MDM / ASSESSMENT AND PLAN / ED COURSE                                Differential diagnosis includes, but is not limited to: Arrhythmia, intracranial hemorrhage, ACS, electrolyte derangement, orthostasis, seizure-like event  ED course: Patient presents and has no focal neurological  deficits.  His EKG demonstrates no acute arrhythmia and has not had any events on the monitor.  He does appear for fluid overloaded slightly however his numbers do not appear worse than what is documented in his last heart failure clinic note (albeit I do not have a troponin listed (.  He has no acidosis.  From a traumatic standpoint, he has no intracranial hemorrhage or broken bones.  Unfortunately these recurrent syncopal episodes are likely dangerous given that he is on Eliquis .  Given his recent admission and the fact that he has been followed by cardiology I will reach out to discuss his case with them.  A repeat troponin is pending at this time  Clinical Course as of 12/23/23 1834  Fri Dec 23, 2023  1318 Hemoglobin(!): 8.4 More anemic than baseline which is 9 but within the realm does not require transfusion at this time [HD]  1320 Of note the triage note states left shoulder pain however on my exam patient endorses right shoulder pain [HD]  1326 Patient reexamined, denies complaints except right shoulder achiness [HD]  1353 Anion gap: 15 No acute event with any DKA [HD]  1354 Creatinine(!): 6.30 Within patient's normal at his creatinine on 12/17/2023 at 6.04 [HD]  1354 Pro Brain Natriuretic Peptide(!): 14,693.0 Very much elevated [HD]  1354 Troponin T High Sensitivity(!!): 211 Very much elevated [HD]  1401 Pro-BNP was pro-BNP 19, 592 (from 9,354) on 11/15 [HD]  1401 Will reach out to [facility] cardiology to discuss [HD]  1418 CT Head Wo Contrast No acute  abnormality [HD]  1418 CT Cervical Spine Wo Contrast No acute abnormality [HD]  1445 Blood gas, venous(!) No acidosis [HD]  1537 [facility] transfer center called and they are attempting to get a cardiology consult.  They stated that they will call back by 4:30 PM [HD]  1624 Talked with [facility] cardiologist Dr.Roy-Chaudhury, he would recommend admission for this patient with to start he will at [facility] and to pursue interrogation of the device and also diuresis with consideration of initiating dialysis.  They do not think he requires transfer to their facility at this time for any specialized purpose.  Patient in agreement with plan [HD]  1632 Notified by telemetry that patient did have a nonsustained burst of ventricular tachycardia.  Case called into hospitalist for admission [HD]    Clinical Course User Index [HD] Nicholaus Rolland BRAVO, MD   -- Risk: 5 This patient has a high risk of morbidity due to further diagnostic testing or treatment. Rationale: This patient's evaluation and management involve a high risk of morbidity due to the potential severity of presenting symptoms, need for diagnostic testing, and/or initiation of treatment that may require close monitoring. The differential includes conditions with potential for significant deterioration or requiring escalation of care. Treatment decisions in the ED, including medication administration, procedural interventions, or disposition planning, reflect this level of risk. COPA: 5 The patient has the following acute or chronic illness/injury that poses a possible threat to life or bodily function: [X] : The patient has a potentially serious acute condition or an acute exacerbation of a chronic illness requiring urgent evaluation and management in the Emergency Department. The clinical presentation necessitates immediate consideration of life-threatening or function-threatening diagnoses, even if they are ultimately ruled out.   FINAL CLINICAL IMPRESSION(S)  / ED DIAGNOSES   Final diagnoses:  Syncope, unspecified syncope type  Hypervolemia, unspecified hypervolemia type  Anticoagulated  Ventricular arrhythmia     Rx / DC Orders   ED  Discharge Orders     None        Note:  This document was prepared using Dragon voice recognition software and may include unintentional dictation errors.   Nicholaus Rolland BRAVO, MD 12/23/23 [phone]

## 2023-12-23 NOTE — ED Notes (Addendum)
 Pt is having 4 to 5 beat runs of Vtach, provider Dr. Nicholaus notified.

## 2023-12-23 NOTE — ED Triage Notes (Signed)
 Per EMS report, patient had a complete loss of conscousness after having a bowel movement. Patient's son heard the fall and found him. Patient has had these episodes before according to history provided by EMT. Patient c/o left shoulder pain. Patient is A&O x4 upon arrival. No incontinence noted, no injury to the tongue. Patient  had a recent work-up for epilepsy. Patient has had a full of LR en route. Patient is on a blood thinner.  CBG 220 Pacemaker 81/42 initial blood pressure  123/71 blood pressure immediately prior to arrival with LR infusing. 70 pulse 99% room air 12L sinus

## 2023-12-24 ENCOUNTER — Encounter: Payer: Self-pay | Admitting: Osteopathic Medicine

## 2023-12-24 ENCOUNTER — Observation Stay: Admit: 2023-12-24 | Discharge: 2023-12-24 | Disposition: A | Attending: Osteopathic Medicine

## 2023-12-24 DIAGNOSIS — E1169 Type 2 diabetes mellitus with other specified complication: Secondary | ICD-10-CM

## 2023-12-24 DIAGNOSIS — I5022 Chronic systolic (congestive) heart failure: Secondary | ICD-10-CM

## 2023-12-24 DIAGNOSIS — Z8739 Personal history of other diseases of the musculoskeletal system and connective tissue: Secondary | ICD-10-CM

## 2023-12-24 DIAGNOSIS — R55 Syncope and collapse: Secondary | ICD-10-CM

## 2023-12-24 DIAGNOSIS — I25118 Atherosclerotic heart disease of native coronary artery with other forms of angina pectoris: Secondary | ICD-10-CM

## 2023-12-24 DIAGNOSIS — I428 Other cardiomyopathies: Secondary | ICD-10-CM

## 2023-12-24 DIAGNOSIS — E876 Hypokalemia: Secondary | ICD-10-CM

## 2023-12-24 DIAGNOSIS — I4729 Other ventricular tachycardia: Secondary | ICD-10-CM | POA: Diagnosis not present

## 2023-12-24 DIAGNOSIS — I480 Paroxysmal atrial fibrillation: Secondary | ICD-10-CM

## 2023-12-24 DIAGNOSIS — I472 Ventricular tachycardia, unspecified: Secondary | ICD-10-CM

## 2023-12-24 DIAGNOSIS — N185 Chronic kidney disease, stage 5: Secondary | ICD-10-CM

## 2023-12-24 DIAGNOSIS — D638 Anemia in other chronic diseases classified elsewhere: Secondary | ICD-10-CM | POA: Insufficient documentation

## 2023-12-24 DIAGNOSIS — I5A Non-ischemic myocardial injury (non-traumatic): Secondary | ICD-10-CM

## 2023-12-24 DIAGNOSIS — E782 Mixed hyperlipidemia: Secondary | ICD-10-CM

## 2023-12-24 DIAGNOSIS — I951 Orthostatic hypotension: Secondary | ICD-10-CM | POA: Diagnosis not present

## 2023-12-24 DIAGNOSIS — E785 Hyperlipidemia, unspecified: Secondary | ICD-10-CM

## 2023-12-24 DIAGNOSIS — Z8673 Personal history of transient ischemic attack (TIA), and cerebral infarction without residual deficits: Secondary | ICD-10-CM

## 2023-12-24 LAB — ECHOCARDIOGRAM COMPLETE
AR max vel: 2.22 cm2
AV Area VTI: 2.13 cm2
AV Area mean vel: 1.99 cm2
AV Mean grad: 7 mmHg
AV Peak grad: 13.4 mmHg
Ao pk vel: 1.83 m/s
Area-P 1/2: 2.72 cm2
Calc EF: 49 %
Height: 68 in
MV VTI: 2.53 cm2
S' Lateral: 4.3 cm
Single Plane A2C EF: 50.2 %
Single Plane A4C EF: 53.9 %
Weight: 4240 [oz_av]

## 2023-12-24 LAB — TSH: TSH: 0.998 u[IU]/mL (ref 0.350–4.500)

## 2023-12-24 LAB — GLUCOSE, CAPILLARY
Glucose-Capillary: 91 mg/dL (ref 70–99)
Glucose-Capillary: 88 mg/dL (ref 70–99)
Glucose-Capillary: 90 mg/dL (ref 70–99)
Glucose-Capillary: 106 mg/dL — ABNORMAL HIGH (ref 70–99)

## 2023-12-24 LAB — BASIC METABOLIC PANEL WITH GFR
Anion gap: 13 (ref 5–15)
BUN: 80 mg/dL — ABNORMAL HIGH (ref 6–20)
CO2: 25 mmol/L (ref 22–32)
Calcium: 8.4 mg/dL — ABNORMAL LOW (ref 8.9–10.3)
Chloride: 104 mmol/L (ref 98–111)
Creatinine, Ser: 6.39 mg/dL — ABNORMAL HIGH (ref 0.61–1.24)
GFR, Estimated: 10 mL/min — ABNORMAL LOW (ref >60)
Glucose, Bld: 100 mg/dL — ABNORMAL HIGH (ref 70–99)
Potassium: 3.8 mmol/L (ref 3.5–5.1)
Sodium: 142 mmol/L (ref 135–145)

## 2023-12-24 LAB — MAGNESIUM: Magnesium: 2.2 mg/dL (ref 1.7–2.4)

## 2023-12-24 LAB — TROPONIN T, HIGH SENSITIVITY: Troponin T High Sensitivity: 226 ng/L (ref 0–19)

## 2023-12-24 LAB — PRO BRAIN NATRIURETIC PEPTIDE: Pro Brain Natriuretic Peptide: 17045 pg/mL — ABNORMAL HIGH (ref <300.0)

## 2023-12-24 MED ORDER — INSULIN GLARGINE-YFGN 100 UNIT/ML ~~LOC~~ SOLN
4.0000 [IU] | Freq: Every day | SUBCUTANEOUS | Status: DC
  Administered 2023-12-24: 4 [IU] via SUBCUTANEOUS
  Filled 2023-12-24: qty 0.04

## 2023-12-24 MED ORDER — INSULIN ASPART 100 UNIT/ML IJ SOLN: 0.0000 [IU] | Freq: Three times a day (TID) | INTRAMUSCULAR | Status: DC

## 2023-12-24 MED ORDER — POTASSIUM CHLORIDE 10 MEQ/100ML IV SOLN
10.0000 meq | Freq: Once | INTRAVENOUS | Status: AC
  Administered 2023-12-24: 10 meq via INTRAVENOUS
  Filled 2023-12-24: qty 100

## 2023-12-24 MED ORDER — ISOSORB DINITRATE-HYDRALAZINE 20-37.5 MG PO TABS
0.5000 | ORAL_TABLET | Freq: Three times a day (TID) | ORAL | Status: DC
  Administered 2023-12-24 – 2023-12-25 (×3): 0.5 via ORAL
  Filled 2023-12-24 (×4): qty 0.5

## 2023-12-24 MED ORDER — INSULIN ASPART 100 UNIT/ML IJ SOLN: 0.0000 [IU] | Freq: Every day | INTRAMUSCULAR | Status: DC

## 2023-12-24 MED ORDER — POTASSIUM CHLORIDE CRYS ER 20 MEQ PO TBCR
40.0000 meq | EXTENDED_RELEASE_TABLET | Freq: Once | ORAL | Status: AC
  Administered 2023-12-24: 40 meq via ORAL
  Filled 2023-12-24: qty 2

## 2023-12-24 NOTE — Progress Notes (Signed)
 Progress Note   Patient: Terry [name] FMW:[id number] DOB: [birth year] DOA: 12/23/2023     0 DOS: the patient was seen and examined on 12/24/2023   Brief hospital course: 48 year old man past medical history of heart failure with improved ejection fraction, nonischemic cardiomyopathy with AICD, chronic kidney disease stage V, paroxysmal atrial fibrillation on Eliquis , history of CAD AVA, type 2 diabetes mellitus, multiple syncopal episodes presents to the ER with syncopal episode after a bowel movement.  He went to stand up and then lost consciousness.  He was unresponsive and eyes were open but did not answer some right away.  He felt sweaty.  No confusion after he came improved.  Patient had a few runs of asymptomatic ventricular tachycardia on telemetry.  11/22.  Patient feeling well this morning.  Had 29 beats of ventricular tachycardia.  [employer] came to interrogate the AICD.  The AICD only fired as if the rate goes over 200 and has rate was not over 200 for this.  Patient had slight orthostasis and BiDil  dose decreased.  Assessment and Plan: * Syncope Could be vasovagal syncope after having bowel movement versus orthostatic hypotension.  Orthostatic hypotension Will start with cutting back by Bidil  dose.  Ventricular tachycardia (HCC) Patient had 29 beats of ventricular tachycardia asymptomatic.  Has also had small runs.  AICD interrogated by [employer] rep.  Apparently the AICD does not fire unless the rate is over 200.  His rates were not over 200.  Patient on amiodarone .  Chronic systolic CHF (congestive heart failure) (HCC) Patient has improved ejection fraction.  Today's EF 50 to 55%.  Patient on oral torsemide , Coreg  and BiDil .  Unable to give ACE/ARB or spironolactone with chronic kidney disease stage V.  CKD (chronic kidney disease) stage 5, GFR less than 15 ml/min [facility]) Follow-up with nephrology as outpatient.  Anemia of chronic disease Check hemoglobin again  tomorrow  History of gout On allopurinol  twice a day  Type 2 diabetes mellitus with hyperlipidemia (HCC) Decrease Semglee  insulin  to 4 units at night.  Get rid of standing dose insulin  before meals.  Will put on sliding scale very sensitive.  History of CVA (cerebrovascular accident) Continue Eliquis  and cholesterol medication  Paroxysmal atrial fibrillation (HCC) On amiodarone , Coreg , Eliquis .  Myocardial injury This is not myocardial infarction.  Likely demand ischemia from chronic kidney disease, chronic congestive heart failure.        Subjective: Patient stated he got up from going to the bathroom and passed out.  No loss of bowel or bladder function.  No tongue bite.  Felt okay after coming through.  Had ventricular tachycardia  Physical Exam: Vitals:   12/23/23 1928 12/23/23 2356 12/24/23 0404 12/24/23 0745  BP: (!) 161/84 112/64 127/72 130/74  Pulse: 80 76 76 70  Resp: 18 18 18 18   Temp: 98.7 F (37.1 C) 98.9 F (37.2 C) 97.7 F (36.5 C) 98.4 F (36.9 C)  TempSrc: Oral Oral Oral   SpO2: 100% 99% 99% 99%  Weight:      Height:       Physical Exam HENT:     Head: Normocephalic.     Mouth/Throat:     Pharynx: No oropharyngeal exudate.  Eyes:     General: Lids are normal.     Conjunctiva/sclera: Conjunctivae normal.  Cardiovascular:     Rate and Rhythm: Normal rate and regular rhythm.     Heart sounds: Normal heart sounds, S1 normal and S2 normal.  Pulmonary:     Breath  sounds: No decreased breath sounds, wheezing, rhonchi or rales.  Abdominal:     Palpations: Abdomen is soft.     Tenderness: There is no abdominal tenderness.  Musculoskeletal:     Right lower leg: Swelling present.     Left lower leg: Swelling present.  Skin:    General: Skin is warm.     Findings: No rash.  Neurological:     Mental Status: He is alert and oriented to person, place, and time.     Data Reviewed: Sodium 142, potassium 3.8, creatinine 6.39, BUN 80, GFR 10, proBNP  17,045, troponin 226, hemoglobin 8.4  Disposition: Status is: Observation Watch again overnight.  Coreg  initially cut back by admitting physician and will cut back BiDil  today.  Continue to monitor orthostatics.  Planned Discharge Destination: Home    Time spent: 35 minutes Case discussed with nursing staff, cardiology, [employer] rep.  Author: Charlie Patterson, MD 12/24/2023 4:22 PM  For on call review [email or web address].

## 2023-12-24 NOTE — Plan of Care (Signed)
   Problem: Education: Goal: Knowledge of General Education information will improve Description Including pain rating scale, medication(s)/side effects and non-pharmacologic comfort measures Outcome: Progressing

## 2023-12-24 NOTE — Consult Note (Addendum)
 Cardiology Consult    Patient ID: Terry [name] MRN: [id number], DOB/AGE: [birth year]   Admit date: 12/23/2023 Date of Consult: 12/24/2023  Primary Physician: Freddrick Johns Primary Cardiologist: [facility]   Requesting Provider: R. Josette, MD  Patient Profile    Terry [name] is a 48 y.o. 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 Josette.  Past Medical History   Subjective  Past Medical History:  Diagnosis Date   AICD (automatic cardioverter/defibrillator) present    a. 10/2010 s/p MDT single lead ICD; b. 12/2019 s/p gen change (MDT Cobalt XT VR [id number] S).   Atypical atrial flutter (HCC)    a. 03/2022 s/p RFCA.   Carotid arterial disease    a. 02/2023 Carotid U/S: <50% bilat ICA stenoses.   CKD (chronic kidney disease) stage 5, GFR less than 15 ml/min (HCC)    Coronary artery disease    a. 02/2010 MV: No isch. EF 54%; b. 01/2022 Cath: LM nl, LAD mild diffuse irregs, D1 40, LCX 80-90d (small). OM3 mild irregs. RCA mild irregs mid/dist-->Med rx.   Diabetes mellitus without complication (HCC)    Gout    Heart failure with mid-range ejection fraction (HCC)    a. 01/2023 TEE: EF 25% (in setting of AFib); b. 02/2023 Echo: EF 50-55%; c. 06/2023 Echo: EF 45-50%, glob HK, sev dil LV. GrIII DD. Nl RV fxn. Mild BAE. Mild-mod MR/TR. AoV sclerosis w/o stenosis. Ao root 40mm, Asc Ao 36mm; c. 07/2023  RHC: RA 11, PA 94/28 (55), PCWP 27 (prominent V waves). CO/CI 6.0/2.5. PVR 4.59-4.67 WU. s/p CardioMEMS implant.   Mixed Ischemic/Nonischemic Cardiomyopathy    Morbid obesity (HCC)    PAF (paroxysmal atrial fibrillation) (HCC)    a. 01/2023 TEE/DCCV: EF 25%, no LAA thrombus. Successful DCCV - 50J; b. CHA2DS2VASc = 6-->Eliquis .   Stroke (HCC)    a. 02/2023 MRI: Small acute infarction of the medial right central sulcus and adjacent white matter.  Unremarkable MRA.  Remote lacunar infarct of the left external capsule with associated hemosiderin deposition.  Small remote hemorrhage in the left posterior parietal lobe.    Past Surgical History:  Procedure Laterality Date   ABLATION     CARDIAC   CARDIAC DEFIBRILLATOR PLACEMENT     CATARACT EXTRACTION W/PHACO Right 02/15/2017   Procedure: CATARACT EXTRACTION PHACO AND INTRAOCULAR LENS PLACEMENT (IOC);  Surgeon: Jaye Fallow, MD;  Location: [facility] ORS;  Service: Ophthalmology;  Laterality: Right;  US  00:48 AP% 14.8 CDE 7.11 Fluid pack lot # [id number]   CATARACT EXTRACTION W/PHACO Left 03/15/2017   Procedure: CATARACT EXTRACTION PHACO AND INTRAOCULAR LENS PLACEMENT (IOC);  Surgeon: Jaye Fallow, MD;  Location: [facility] ORS;  Service: Ophthalmology;  Laterality: Left;  US  00:46.0 AP% 11.2 CDE 5.15 FLUID PACK LOT # [id number]   CORONARY ANGIOPLASTY     INSERT / REPLACE / REMOVE PACEMAKER       Allergies  No Known Allergies     History of Present Illness   Subjective  48 year old male with  a history of chronic heart failure with reduced ejection fraction with subsequent improvement/midrange EF status post CardioMEMS placement in June 2025, mixed ischemic and nonischemic dermopathy, ventricular tachycardia status post single lead AICD, coronary artery disease, paroxysmal atrial fibrillation status post catheter ablation in 2012, atypical atrial flutter status post catheter ablation in February 2024, type 2 diabetes mellitus, stroke (January  2025), 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  Patient was initially diagnosed with nonischemic cardiomyopathy and HFrEF in 2010, at which time EF was as low as 15 to 20%.  He underwent single-lead ICD placement in 2012 and subsequently required generator change out in 2021.  He initially had recovery of EF but over the years, ejection fraction has been variable.  He has been followed very closely at [facility] by heart failure and electrophysiology.  He underwent A-fib ablation in 2012 and more recently required repeat ablation for atypical atrial flutter in February 2024.  In December 2024, he had recurrent atrial fibrillation.  TEE at that time showed an EF of 25%.  He underwent successful cardioversion and was later placed on amiodarone .  In January 2025, he was admitted with stroke with MRI showing small acute infarct of the medial right central sulcus and adjacent white matter.  MRA was unremarkable.  Old lacunar infarcts were noted.  He is chronically coagulated with Eliquis .  In May 2025, he was admitted to [facility] with syncope.  ICD interrogation was unremarkable.  Syncope was felt to be vasovagal in nature.  Echocardiogram during that admission showed an EF of 45 to 50% with grade 3 diastolic dysfunction and mild to moderate MR/TR, and aortic valve sclerosis.  He subsequently follow-up with his providers at [facility] and underwent right heart cath in June 2025 which showed significant volume overload and pulmonary hypertension [RA 11, PA 94/28 (55), PCWP 27 with prominent V waves, PVR 4.59-4.67 [place] units).  A CardioMEMS was placed and diuretics were adjusted.  Patient was admitted to [facility] in late October 2025 with complaints of disorientation, reduced responsiveness, dyspnea, and fatigue.  There was initial concern for epileptic activity though EEG/EMU did not suggest seizure activity.  There was also concern related to heart failure and arrhythmia.   Patient had runs of nonsustained VT on 23.  ICD showed no therapies or sustained arrhythmias to explain presentation.  OptiVol did suggest fluid accumulation.  Patient was subsequently discharged off of Keppra  given low suspicion for epileptic activity and has since followed up in heart failure clinic on November 14 with PA diastolic of 26 on CardioMEMS, and he was contacted and advised to take metolazone.    Since October, in the setting of Mounjaro therapy, Mr. Common notes that he's lost about 27 lbs.  Since his most recent dose of metolazone last week, he has not received notification about any recurrent elevation in PAD.  His wt is down 7 lbs over the past week.  He has remained reasonably active w/o symptoms or limitations and denies any chest pain, dyspnea, pnd, orthopnea, or edema.  He was in his USOH until the morning of 11/21, when about 30 mins after taking his morning meds (amio, amlodipine 5, eliquis , carvedilol  25, bidil  20-37.5, torsemide  100), he sat to have a BM.  While on the toilet, he felt lightheaded.  After having his BM, he stood and started walking out of the bathroom.  He felt more lightheaded and suddenly lost consciousness.  His son heard him hit the  floor and attended to him immediately.  Pt was not initially responsive.  His son pulled him out of the bathroom and into the hallway, which does the pt doesn't remember.  Shortly thereafter, pt regained consciousness and felt very hot.  When EMS arrived, he tried to sit up but [facility] returned.  Systolic BP on EMS eval was reportedly 90.    He was transported to the ED and on presentation he was afebrile and normotensive at 116/77.  Pulse ox was 99% on room air.  ECG showed sinus rhythm with a rightward axis and inferolateral ST depression (slightly more pronounced than what was seen on prior ECGs).  QTc was prolonged at 486.  Lab work notable for BUN/creatinine of 73/6.30, H&H 8.4/26.6 with normal white count, BMP 14,693, troponin of 211   204  226.  Follow-up BMP this morning 17,045.0.  Chest x-ray showed mild cardiomegaly and no acute cardiopulmonary process.  Right shoulder x-ray showed no fracture or dislocation.  CT head showed no acute intracranial or cervical spine abnormality.  Inpatient Medications   Subjective    amiodarone   200 mg Oral Daily   apixaban   2.5 mg Oral BID   atorvastatin   40 mg Oral Daily   carvedilol   25 mg Oral BID WC   insulin  aspart  5 Units Subcutaneous TID WC   insulin  glargine-yfgn  8 Units Subcutaneous QHS   isosorbide -hydrALAZINE   1 tablet Oral TID   torsemide   100 mg Oral Daily    Family History    Family History  Problem Relation Age of Onset   Ovarian cancer Mother    Diabetes Mellitus II Father    Heart disease Father    Hypertension Father    He indicated that the status of his mother is unknown. He indicated that the status of his father is unknown.   Social History    Social History   Socioeconomic History   Marital status: Divorced    Spouse name: Not on file   Number of children: Not on file   Years of education: Not on file   Highest education level: Not on file  Occupational History   Not on file  Tobacco Use   Smoking status: Never   Smokeless tobacco: Never  Vaping Use   Vaping status: Never Used  Substance and Sexual Activity   Alcohol use: No   Drug use: No   Sexual activity: Yes  Other Topics Concern   Not on file  Social History Narrative   Lives locally w/ son.  Active but does not routinely exercise.   Social Drivers of Corporate Investment Banker Strain: Low Risk (12/01/2023)   Received from [facility]   Overall Financial Resource Strain (CARDIA)    How hard is it for you to pay for the very basics like food, housing, medical care, and heating?: Not hard at all  Food Insecurity: No Food Insecurity (12/23/2023)   Hunger Vital Sign    Worried About Running Out of Food in the Last Year: Never true    Ran Out of Food in the Last Year:  Never true  Transportation Needs: No Transportation Needs (12/23/2023)   PRAPARE - Administrator, Civil Service (Medical): No    Lack of Transportation (Non-Medical): No  Physical Activity: Not on file  Stress: Not on file  Social Connections: Unknown (06/13/2023)   Social Connection and Isolation Panel    Frequency of Communication with Friends and Family: Not on  file    Frequency of Social Gatherings with Friends and Family: Not on file    Attends Religious Services: Not on file    Active Member of Clubs or Organizations: Not on file    Attends Banker Meetings: Not on file    Marital Status: Divorced  Intimate Partner Violence: Not At Risk (12/23/2023)   Humiliation, Afraid, Rape, and Kick questionnaire    Fear of Current or Ex-Partner: No    Emotionally Abused: No    Physically Abused: No    Sexually Abused: No     Review of Systems    General:  No chills, fever, night sweats or weight changes.  Cardiovascular:  +++ presyncope/syncope. No chest pain, dyspnea on exertion, edema, orthopnea, palpitations, paroxysmal nocturnal dyspnea. Dermatological: No rash, lesions/masses Respiratory: No cough, dyspnea Urologic: No hematuria, dysuria Abdominal:   No nausea, vomiting, diarrhea, bright red blood per rectum, melena, or hematemesis Neurologic:  No visual changes, wkns, changes in mental status. All other systems reviewed and are otherwise negative except as noted above.    Exam, Labs, Other    Objective   Physical Exam    Blood pressure 130/74, pulse 70, temperature 98.4 F (36.9 C), resp. rate 18, height 5' 8 (1.727 m), weight 120.2 kg, SpO2 99%.  General: Pleasant, NAD Psych: Normal affect. Neuro: Alert and oriented X 3. Moves all extremities spontaneously. HEENT: Normal  Neck: Supple without bruits or JVD. Lungs:  Resp regular and unlabored, CTA. Heart: RRR no s3, s4, or murmurs. Abdomen: Soft, non-tender, non-distended, BS + x 4.   Extremities: No clubbing, cyanosis or edema. DP/PT2+, Radials 2+ and equal bilaterally.  Labs      HsTrop T  211  204  226  ProBNP    Component Value Date/Time   PROBNP 17,045.0 (H) 12/24/2023 0559    Lab Results  Component Value Date   WBC 5.7 12/23/2023   HGB 8.4 (L) 12/23/2023   HCT 26.6 (L) 12/23/2023   MCV 92.0 12/23/2023   PLT 153 12/23/2023    Recent Labs  Lab 12/23/23 1300 12/23/23 2144 12/24/23 0559  NA 139   < > 142  K 3.8   < > 3.8  CL 103   < > 104  CO2 21*   < > 25  BUN 73*   < > 80*  CREATININE 6.30*   < > 6.39*  CALCIUM  8.0*   < > 8.4*  PROT 6.3*  --   --   BILITOT 0.6  --   --   ALKPHOS 87  --   --   ALT <5  --   --   AST 13*  --   --   GLUCOSE 159*   < > 100*   < > = values in this interval not displayed.   Lab Results  Component Value Date   CHOL 124 08/30/2019   HDL 27 (L) 08/30/2019   LDLCALC 70 08/30/2019   TRIG 134 08/30/2019      Radiology Studies    DG Shoulder Right Result Date: 12/23/2023 EXAM: 1 VIEW(S) XRAY OF THE RIGHT SHOULDER 12/23/2023 01:54:00 PM COMPARISON: None available. CLINICAL HISTORY: fall, pain FINDINGS: BONES AND JOINTS: Glenohumeral joint is normally aligned. No acute fracture or dislocation. Mild degenerative changes of the acromioclavicular joint. SOFT TISSUES: Surgical clips in upper arm. No abnormal calcifications. Visualized lung is unremarkable. IMPRESSION: 1. No acute fracture or dislocation. 2. Mild acromioclavicular joint degenerative changes. Electronically signed by: Waddell Calk MD  12/23/2023 02:41 PM EST RP Workstation: [id number]   DG Chest 2 View Result Date: 12/23/2023 EXAM: 2 VIEW(S) XRAY OF THE CHEST 12/23/2023 01:54:00 PM COMPARISON: 06/15/2023 CLINICAL HISTORY: fall, pain FINDINGS: LINES, TUBES AND DEVICES: Left chest single lead AICD in place. LUNGS AND PLEURA: No focal pulmonary opacity. No pleural effusion. No pneumothorax. HEART AND MEDIASTINUM: Mild cardiomegaly. BONES AND SOFT TISSUES: No  acute fracture or destructive lesion. Multilevel thoracic osteophytosis. IMPRESSION: 1. No acute cardiopulmonary process. 2. Mild cardiomegaly. Electronically signed by: Waddell Calk MD 12/23/2023 02:39 PM EST RP Workstation: [id number]   CT Head Wo Contrast Result Date: 12/23/2023 EXAM: CT HEAD AND CERVICAL SPINE 12/23/2023 01:09:20 PM TECHNIQUE: CT of the head and cervical spine was performed without the administration of intravenous contrast. Multiplanar reformatted images are provided for review. Automated exposure control, iterative reconstruction, and/or weight based adjustment of the mA/kV was utilized to reduce the radiation dose to as low as reasonably achievable. COMPARISON: None available. CLINICAL HISTORY: syncope/fall on eliquis  FINDINGS: CT HEAD BRAIN AND VENTRICLES: No acute intracranial hemorrhage. No mass effect or midline shift. No abnormal extra-axial fluid collection. No evidence of acute infarct. No hydrocephalus. ORBITS: No acute abnormality. SINUSES AND MASTOIDS: No acute abnormality. SOFT TISSUES AND SKULL: No acute skull fracture. No acute soft tissue abnormality. CT CERVICAL SPINE BONES AND ALIGNMENT: No acute fracture or traumatic malalignment. DEGENERATIVE CHANGES: C4-C5 degenerative disc with disease height loss, endplate sclerosis and spurring, and schmorl's nodes. SOFT TISSUES: No prevertebral soft tissue swelling. IMPRESSION: 1. No acute abnormality intracranially or in the cervical spine. 2. C4-C5 degenerative disc disease. Electronically signed by: Gilmore Molt MD 12/23/2023 01:25 PM EST RP Workstation: [id number]   CT Cervical Spine Wo Contrast Result Date: 12/23/2023 EXAM: CT HEAD AND CERVICAL SPINE 12/23/2023 01:09:20 PM TECHNIQUE: CT of the head and cervical spine was performed without the administration of intravenous contrast. Multiplanar reformatted images are provided for review. Automated exposure control, iterative reconstruction, and/or weight based adjustment  of the mA/kV was utilized to reduce the radiation dose to as low as reasonably achievable. COMPARISON: None available. CLINICAL HISTORY: syncope/fall on eliquis  FINDINGS: CT HEAD BRAIN AND VENTRICLES: No acute intracranial hemorrhage. No mass effect or midline shift. No abnormal extra-axial fluid collection. No evidence of acute infarct. No hydrocephalus. ORBITS: No acute abnormality. SINUSES AND MASTOIDS: No acute abnormality. SOFT TISSUES AND SKULL: No acute skull fracture. No acute soft tissue abnormality. CT CERVICAL SPINE BONES AND ALIGNMENT: No acute fracture or traumatic malalignment. DEGENERATIVE CHANGES: C4-C5 degenerative disc with disease height loss, endplate sclerosis and spurring, and schmorl's nodes. SOFT TISSUES: No prevertebral soft tissue swelling. IMPRESSION: 1. No acute abnormality intracranially or in the cervical spine. 2. C4-C5 degenerative disc disease. Electronically signed by: Gilmore Molt MD 12/23/2023 01:25 PM EST RP Workstation: [id number]      ECG & Cardiac Imaging    sinus rhythm with a rightward axis and inferolateral ST depression (slightly more pronounced than what was seen on prior ECGs).  QTc was prolonged at 486 - personally reviewed.  Assessment & Plan    1.  Syncope/VT:  pt w/ h/o mixed ICM/NICM and HFimpEF/HFmrEF along w/ VT s/p single lead ICD in 2010 w/ gen change in 2021.  He was admitted 11/21 after syncopal spell that occurred after BM. He felt lightheaded while on toilet and then became syncopal upon standing.  He was attended to by son, who pulled him out of bathroom and into hallway, which pt has no recollection of.  He regained consciousness  shortly thereafter and felt very hot.  SBP 90 when EMS arrived.  ED eval notable for BUN/Creat 73/6.30 (stable), BNP 14693, K 3.4, and hsTrop 211  204  226.  Imaging negative.  He has had runs of NSVT and a 25 beat run of VT last night, which was asymptomatic.  No recurrent presyncope/syncope.  Device interrogation  this AM unremarkable in the context of him only having a VF zone set @ 200, therefor, no arrhythmias noted > 200 but can't exclude arrhythmias of < 200.  MDT rep set monitoring zone @ 150, w/o therapy, for future events.  I am concerned about orthostasis and potential need to reduce med doses.  Since being placed on Mounjaro, pt reports ~ 30 lbs wt loss, 7 lbs in just the past week.  Check orthostatics.  Will hold amlodipine.  Cont current dose of amio - would not titrate w/ prolonged QT.  2.  Acute on chronic HFmrEF:  EF prev as low as 15% w/ variable measurement since.  Most recently 45-50% by echo in May 2025.  Optivol earlier this month suggested volume overload and PAD via cardioMEMS on 11/14 was elevated @ 26, and pt took a metolazone that day.  Since then, he has lost 7 lbs and notes that he has felt well.  He does not appear volume overloaded on exam today, however, his BNP is 17k this AM and optivol again shows volume excess, which hasn't changed much since last month.  He takes torsemide  100 mg daily and metolazone as needed.  Complicating things is his CKD 5 w/ BUN/Creat of 80/6.39 this AM.  Ongoing volume overload may be driving NSVT/VT. Will d/w Dr. Argentina.  Pending orthostatics, may consider additional diuresis, however w/ CKD 5, nephrology should be involved.  Cont ? blocker, bidil .  No acei/arb/arni/mra/sglt2i in the setting of CKD 5.  3.  PAF/Flutter:  s/p RFCA in 2012 w/ repeat ablation in 03/2022 for atypical flutter.  S/p TEE/DCCV 12/2022.  In sinus on amio.  QTc mildly prolonged in setting of amio/hypokalemia on presentation.  Cont eliquis .  4.  Primary HTN:  Check orthostatics.  Will hold amlodipine for now.  5.  CAD/Demand ischemia:  known severe, small vessel, distal LCX dzs.  HsTrop up to 226 in setting of syncope and CHF.  No chest pain.  F/u echo. Cont ? blocker and statin.  No asa in setting of eliquis .  Unlikely to pursue ischemic eval at this time in setting of CKD 5.  6.  HL:   cont statin.  7.  CKD 5:  See above.  Rec nephrology consultation in setting of what seems to be chronic volume overload and progressive renal failure.  Avoid nephrotoxic agents.  8.  DMII:  Per IM.  9.  Normocytic anemia:  relatively stable.  Risk Assessment/Risk Scores:     TIMI Risk Score for Unstable Angina or Non-ST Elevation MI:   The patient's TIMI risk score is 3, which indicates a 13% risk of all cause mortality, new or recurrent myocardial infarction or need for urgent revascularization in the next 14 days.  New York  Heart Association (NYHA) Functional Class NYHA Class II  CHA2DS2-VASc Score = 6   This indicates a 9.7% annual risk of stroke. The patient's score is based upon: CHF History: 1 HTN History: 1 Diabetes History: 1 Stroke History: 2 Vascular Disease History: 1 Age Score: 0 Gender Score: 0     Signed, Lonni Meager, NP 12/24/2023, 10:48 AM  For questions  or updates, please contact   Please consult [email or web address] for contact info under Cardiology/STEMI.

## 2023-12-24 NOTE — Assessment & Plan Note (Signed)
 Sugars on the lower side.  Continue Mounjaro and get rid of insulin .

## 2023-12-24 NOTE — Plan of Care (Signed)

## 2023-12-24 NOTE — Assessment & Plan Note (Signed)
 On allopurinol  twice weekly

## 2023-12-24 NOTE — Plan of Care (Signed)

## 2023-12-24 NOTE — Assessment & Plan Note (Signed)
 Patient has improved ejection fraction.  Today's EF 50 to 55%.  Patient on oral torsemide , Coreg  and BiDil .  Unable to give ACE/ARB or spironolactone with chronic kidney disease stage V.

## 2023-12-24 NOTE — Assessment & Plan Note (Addendum)
 Will start with cutting back by Bidil  dose.

## 2023-12-24 NOTE — Assessment & Plan Note (Signed)
 Hemoglobin 8.2

## 2023-12-24 NOTE — Assessment & Plan Note (Signed)
 Follow-up with nephrology as outpatient

## 2023-12-24 NOTE — Assessment & Plan Note (Signed)
 Continue Eliquis  and cholesterol medication

## 2023-12-24 NOTE — Hospital Course (Signed)
 48 year old man past medical history of heart failure with improved ejection fraction, nonischemic cardiomyopathy with AICD, chronic kidney disease stage V, paroxysmal atrial fibrillation on Eliquis , history of CAD AVA, type 2 diabetes mellitus, multiple syncopal episodes presents to the ER with syncopal episode after a bowel movement.  He went to stand up and then lost consciousness.  He was unresponsive and eyes were open but did not answer some right away.  He felt sweaty.  No confusion after he came improved.  Patient had a few runs of asymptomatic ventricular tachycardia on telemetry.  11/22.  Patient feeling well this morning.  Had 29 beats of ventricular tachycardia.  [employer] came to interrogate the AICD.  The AICD only fired as if the rate goes over 200 and has rate was not over 200 for this.  Patient had slight orthostasis and BiDil  dose decreased. 11/23.  Patient feeling well.  I ambulated him around the nursing station without any dizziness or lightheadedness.  Patient does have slight orthostasis again this morning.  I held his torsemide  today.  Can restart tomorrow.  TED hose given.

## 2023-12-24 NOTE — Assessment & Plan Note (Signed)
 This is not myocardial infarction.  Likely demand ischemia from chronic kidney disease, chronic congestive heart failure.

## 2023-12-24 NOTE — Assessment & Plan Note (Addendum)
 More likely orthostatic hypotension but could be vasovagal syncope after having a bowel movement.

## 2023-12-24 NOTE — Assessment & Plan Note (Signed)
 On amiodarone , Coreg , Eliquis .

## 2023-12-24 NOTE — Progress Notes (Signed)
*  PRELIMINARY RESULTS* Echocardiogram 2D Echocardiogram has been performed.  Terry [name] C Rozina Pointer 12/24/2023, 11:05 AM

## 2023-12-24 NOTE — Assessment & Plan Note (Signed)
 Patient had 29 beats of ventricular tachycardia asymptomatic.  Has also had small runs.  AICD interrogated by [employer] rep.  Apparently the AICD does not fire unless the rate is over 200.  His rates were not over 200.  Patient on amiodarone .

## 2023-12-25 DIAGNOSIS — I5020 Unspecified systolic (congestive) heart failure: Secondary | ICD-10-CM

## 2023-12-25 DIAGNOSIS — I951 Orthostatic hypotension: Secondary | ICD-10-CM

## 2023-12-25 DIAGNOSIS — I4729 Other ventricular tachycardia: Secondary | ICD-10-CM | POA: Diagnosis not present

## 2023-12-25 DIAGNOSIS — R634 Abnormal weight loss: Secondary | ICD-10-CM | POA: Insufficient documentation

## 2023-12-25 DIAGNOSIS — I472 Ventricular tachycardia, unspecified: Secondary | ICD-10-CM | POA: Diagnosis not present

## 2023-12-25 DIAGNOSIS — N179 Acute kidney failure, unspecified: Secondary | ICD-10-CM | POA: Diagnosis not present

## 2023-12-25 DIAGNOSIS — I428 Other cardiomyopathies: Secondary | ICD-10-CM | POA: Diagnosis not present

## 2023-12-25 DIAGNOSIS — I5022 Chronic systolic (congestive) heart failure: Secondary | ICD-10-CM | POA: Diagnosis not present

## 2023-12-25 DIAGNOSIS — E876 Hypokalemia: Secondary | ICD-10-CM | POA: Diagnosis not present

## 2023-12-25 DIAGNOSIS — N189 Chronic kidney disease, unspecified: Secondary | ICD-10-CM

## 2023-12-25 LAB — CBC
HCT: 26.5 % — ABNORMAL LOW (ref 39.0–52.0)
Hemoglobin: 8.2 g/dL — ABNORMAL LOW (ref 13.0–17.0)
MCH: 28.7 pg (ref 26.0–34.0)
MCHC: 30.9 g/dL (ref 30.0–36.0)
MCV: 92.7 fL (ref 80.0–100.0)
Platelets: 156 K/uL (ref 150–400)
RBC: 2.86 MIL/uL — ABNORMAL LOW (ref 4.22–5.81)
RDW: 12.9 % (ref 11.5–15.5)
WBC: 4.6 K/uL (ref 4.0–10.5)
nRBC: 0 % (ref 0.0–0.2)

## 2023-12-25 LAB — BASIC METABOLIC PANEL WITH GFR
Anion gap: 12 (ref 5–15)
BUN: 76 mg/dL — ABNORMAL HIGH (ref 6–20)
CO2: 27 mmol/L (ref 22–32)
Calcium: 8.3 mg/dL — ABNORMAL LOW (ref 8.9–10.3)
Chloride: 99 mmol/L (ref 98–111)
Creatinine, Ser: 6.55 mg/dL — ABNORMAL HIGH (ref 0.61–1.24)
GFR, Estimated: 10 mL/min — ABNORMAL LOW (ref >60)
Glucose, Bld: 117 mg/dL — ABNORMAL HIGH (ref 70–99)
Potassium: 3.6 mmol/L (ref 3.5–5.1)
Sodium: 138 mmol/L (ref 135–145)

## 2023-12-25 LAB — GLUCOSE, CAPILLARY: Glucose-Capillary: 112 mg/dL — ABNORMAL HIGH (ref 70–99)

## 2023-12-25 MED ORDER — ISOSORB DINITRATE-HYDRALAZINE 20-37.5 MG PO TABS: 0.5000 | ORAL_TABLET | Freq: Three times a day (TID) | ORAL | Status: AC

## 2023-12-25 MED ORDER — APIXABAN 2.5 MG PO TABS: 2.5000 mg | ORAL_TABLET | Freq: Two times a day (BID) | ORAL | 0 refills | Status: AC

## 2023-12-25 MED ORDER — CARVEDILOL 25 MG PO TABS: 25.0000 mg | ORAL_TABLET | Freq: Two times a day (BID) | ORAL | 0 refills | Status: AC

## 2023-12-25 NOTE — Assessment & Plan Note (Signed)
 AKI on CKD stage V.  Held torsemide  today.  Cut back on medications for orthostatic hypotension.  Hesitant on giving fluid bolus with his heart failure status.  Recommended following up with nephrology for evaluation and can consider dialysis in the future.

## 2023-12-25 NOTE — Discharge Instructions (Signed)
 Check blood pressure lying sitting and standing and keep log for your medical doctor and cardiologist Check bmp (labs with follow up appointment)

## 2023-12-25 NOTE — Progress Notes (Signed)
 Cardiology Progress Note   Patient Name: Terry [name] Date of Encounter: 12/25/2023  Primary Cardiologist: [facility]  Subjective   Feels well this AM.  No recurrent presyncope/syncope.  Orthostatic by BP yesterday - meds adjusted.  No c/p or dyspnea. Objective   Inpatient Medications    Scheduled Meds:  amiodarone   200 mg Oral Daily   apixaban   2.5 mg Oral BID   atorvastatin   40 mg Oral Daily   carvedilol   25 mg Oral BID WC   insulin  aspart  0-5 Units Subcutaneous QHS   insulin  aspart  0-6 Units Subcutaneous TID WC   insulin  glargine-yfgn  4 Units Subcutaneous QHS   isosorbide -hydrALAZINE   0.5 tablet Oral TID   Continuous Infusions:  PRN Meds: acetaminophen  **OR** acetaminophen , oxyCODONE    Vital Signs    Vitals:   12/24/23 2000 12/24/23 2346 12/25/23 0000 12/25/23 0423  BP:  117/71  110/60  Pulse:  67  67  Resp: 18  18 19   Temp:  97.8 F (36.6 C)  (!) 97.5 F (36.4 C)  TempSrc:    Oral  SpO2:  99%  97%  Weight:      Height:        Intake/Output Summary (Last 24 hours) at 12/25/2023 0753 Last data filed at 12/25/2023 0423 Gross per 24 hour  Intake 1200 ml  Output 1450 ml  Net -250 ml   Filed Weights   12/23/23 1250  Weight: 120.2 kg    Physical Exam   GEN: Well nourished, well developed, in no acute distress.  HEENT: Grossly normal.  Neck: Supple, no JVD, carotid bruits, or masses. Cardiac: RRR, no murmurs, rubs, or gallops. No clubbing, cyanosis, edema.  Radials 2+, DP/PT 2+ and equal bilaterally.  Respiratory:  Respirations regular and unlabored, clear to auscultation bilaterally. GI: Soft, nontender, nondistended, BS + x 4. MS: no deformity or atrophy. Skin: warm and dry, no rash. Neuro:  Strength and sensation are intact. Psych: AAOx3.  Normal affect.  Labs    Chemistry Recent Labs  Lab 12/23/23 1300 12/23/23 2144 12/24/23 0559 12/25/23 0416  NA 139 142 142 138  K 3.8 3.4* 3.8 3.6  CL 103 106 104 99  CO2 21* 24 25 27   GLUCOSE  159* 101* 100* 117*  BUN 73* 77* 80* 76*  CREATININE 6.30* 6.03* 6.39* 6.55*  CALCIUM  8.0* 7.9* 8.4* 8.3*  PROT 6.3*  --   --   --   ALBUMIN  3.3*  --   --   --   AST 13*  --   --   --   ALT <5  --   --   --   ALKPHOS 87  --   --   --   BILITOT 0.6  --   --   --   GFRNONAA 10* 11* 10* 10*  ANIONGAP 15 12 13 12      Hematology Recent Labs  Lab 12/23/23 1300 12/25/23 0416  WBC 5.7 4.6  RBC 2.89* 2.86*  HGB 8.4* 8.2*  HCT 26.6* 26.5*  MCV 92.0 92.7  MCH 29.1 28.7  MCHC 31.6 30.9  RDW 12.9 12.9  PLT 153 156    Cardiac Enzymes   HsTrop T  211  204  226  BNP    Component Value Date/Time   BNP 60.5 08/29/2019 0452    ProBNP    Component Value Date/Time   PROBNP 17,045.0 (H) 12/24/2023 0559     Lipids  Lab Results  Component Value Date  CHOL 124 08/30/2019   HDL 27 (L) 08/30/2019   LDLCALC 70 08/30/2019   TRIG 134 08/30/2019   CHOLHDL 4.6 08/30/2019    HbA1c  Lab Results  Component Value Date   HGBA1C 6.4 (H) 06/14/2023    Radiology    DG Shoulder Right Result Date: 12/23/2023 EXAM: 1 VIEW(S) XRAY OF THE RIGHT SHOULDER 12/23/2023 01:54:00 PM COMPARISON: None available. CLINICAL HISTORY: fall, pain FINDINGS: BONES AND JOINTS: Glenohumeral joint is normally aligned. No acute fracture or dislocation. Mild degenerative changes of the acromioclavicular joint. SOFT TISSUES: Surgical clips in upper arm. No abnormal calcifications. Visualized lung is unremarkable. IMPRESSION: 1. No acute fracture or dislocation. 2. Mild acromioclavicular joint degenerative changes. Electronically signed by: Waddell Calk MD 12/23/2023 02:41 PM EST RP Workstation: [id number]   DG Chest 2 View Result Date: 12/23/2023 EXAM: 2 VIEW(S) XRAY OF THE CHEST 12/23/2023 01:54:00 PM COMPARISON: 06/15/2023 CLINICAL HISTORY: fall, pain FINDINGS: LINES, TUBES AND DEVICES: Left chest single lead AICD in place. LUNGS AND PLEURA: No focal pulmonary opacity. No pleural effusion. No pneumothorax.  HEART AND MEDIASTINUM: Mild cardiomegaly. BONES AND SOFT TISSUES: No acute fracture or destructive lesion. Multilevel thoracic osteophytosis. IMPRESSION: 1. No acute cardiopulmonary process. 2. Mild cardiomegaly. Electronically signed by: Waddell Calk MD 12/23/2023 02:39 PM EST RP Workstation: [id number]   CT Head Wo Contrast Result Date: 12/23/2023 EXAM: CT HEAD AND CERVICAL SPINE 12/23/2023 01:09:20 PM TECHNIQUE: CT of the head and cervical spine was performed without the administration of intravenous contrast. Multiplanar reformatted images are provided for review. Automated exposure control, iterative reconstruction, and/or weight based adjustment of the mA/kV was utilized to reduce the radiation dose to as low as reasonably achievable. COMPARISON: None available. CLINICAL HISTORY: syncope/fall on eliquis  FINDINGS: CT HEAD BRAIN AND VENTRICLES: No acute intracranial hemorrhage. No mass effect or midline shift. No abnormal extra-axial fluid collection. No evidence of acute infarct. No hydrocephalus. ORBITS: No acute abnormality. SINUSES AND MASTOIDS: No acute abnormality. SOFT TISSUES AND SKULL: No acute skull fracture. No acute soft tissue abnormality. CT CERVICAL SPINE BONES AND ALIGNMENT: No acute fracture or traumatic malalignment. DEGENERATIVE CHANGES: C4-C5 degenerative disc with disease height loss, endplate sclerosis and spurring, and schmorl's nodes. SOFT TISSUES: No prevertebral soft tissue swelling. IMPRESSION: 1. No acute abnormality intracranially or in the cervical spine. 2. C4-C5 degenerative disc disease. Electronically signed by: Gilmore Molt MD 12/23/2023 01:25 PM EST RP Workstation: [id number]   CT Cervical Spine Wo Contrast Result Date: 12/23/2023 EXAM: CT HEAD AND CERVICAL SPINE 12/23/2023 01:09:20 PM TECHNIQUE: CT of the head and cervical spine was performed without the administration of intravenous contrast. Multiplanar reformatted images are provided for review. Automated  exposure control, iterative reconstruction, and/or weight based adjustment of the mA/kV was utilized to reduce the radiation dose to as low as reasonably achievable. COMPARISON: None available. CLINICAL HISTORY: syncope/fall on eliquis  FINDINGS: CT HEAD BRAIN AND VENTRICLES: No acute intracranial hemorrhage. No mass effect or midline shift. No abnormal extra-axial fluid collection. No evidence of acute infarct. No hydrocephalus. ORBITS: No acute abnormality. SINUSES AND MASTOIDS: No acute abnormality. SOFT TISSUES AND SKULL: No acute skull fracture. No acute soft tissue abnormality. CT CERVICAL SPINE BONES AND ALIGNMENT: No acute fracture or traumatic malalignment. DEGENERATIVE CHANGES: C4-C5 degenerative disc with disease height loss, endplate sclerosis and spurring, and schmorl's nodes. SOFT TISSUES: No prevertebral soft tissue swelling. IMPRESSION: 1. No acute abnormality intracranially or in the cervical spine. 2. C4-C5 degenerative disc disease. Electronically signed by: Gilmore Molt MD 12/23/2023 01:25 PM EST  RP Workstation: [id number]     Telemetry    RSR, brief runs of SVT and NSVT - Personally Reviewed  Cardiac Studies   2D Echocardiogram 11.22.2025  1. Left ventricular ejection fraction, by estimation, is 50 to 55%. The  left ventricle has low normal function. Left ventricular endocardial  border not optimally defined to evaluate regional wall motion. The left  ventricular internal cavity size was  mildly dilated. There is severe concentric left ventricular hypertrophy.  Left ventricular diastolic parameters are consistent with Grade I  diastolic dysfunction (impaired relaxation).   2. Right ventricular systolic function is normal. The right ventricular  size is mildly enlarged. There is mildly elevated pulmonary artery  systolic pressure. The estimated right ventricular systolic pressure is  36.4 mmHg.   3. The mitral valve is normal in structure. Mild to moderate mitral valve   regurgitation.   4. The aortic valve has an indeterminant number of cusps. Aortic valve  regurgitation is not visualized. Aortic valve sclerosis/calcification is  present, without any evidence of aortic stenosis.   5. The inferior vena cava is normal in size with greater than 50%  respiratory variability, suggesting right atrial pressure of 3 mmHg.  _____________   Patient Profile     49 y.o. 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  Asymptomatic runs of nonsustained VT noted on telemetry.  Assessment & Plan    1.  Syncope/VT: Patient with history of mixed ischemic/nonischemic cardiomyopathy and heart failure improved ejection fraction/heart failure midrange ejection fraction along with VT status post single-lead ICD in 2010 with GEN change 2021.  He was admitted November 21 after syncopal spell that occurred after bowel movement.  He felt lightheaded upon standing and subsequently lost consciousness and fell to the floor.  He felt very hot upon awakening within a minute or so.  Device interrogation by [employer] rep on November 22 unremarkable in the setting of patient only having a VF zone set at 200.  There were no arrhythmias noted greater than 200 but unable to exclude arrhythmias of rate less than 200.  A monitoring zone was set at 150 without therapy, for future events.  He was found to be orthostatic by vital  signs with a drop in pressure from 131/74 sitting to 96/67 after standing for 3 minutes.  Heart rates were stable.  Home dose of amlodipine was discontinued and BiDil  was reduced to half a tablet 3 times daily.  Feels good this AM.  Few brief runs of SVT and NSVT on tele overnight.  Asymptomatic.  Cont current doses of amio/carvedilol .  Patient has lost approximate 30 pounds over the past 2 months in the setting of Mounjaro therapy, which is likely contributing to reduced medication needs.  Rec early outpt f/u w/ his EP and HF teams @ [facility].  2.  Acute on chronic heart failure with midrange ejection fraction: EF previously as low as 15% with variable measurements since.  EF was 45 to 50% by echo in May 2025 and echo yesterday showed an  EF of 50-55% with severe concentric LVH, grade 1 diastolic dysfunction, normal RV function, RVSP of 36.4 mmHg, and mild to moderate MR.  He does not appear volume overloaded on examination however, device interrogation showed fluid accumulation by OptiVol.  In the setting of orthostasis and syncope, we have continued oral dose of torsemide  and recommend early f/u w/ his CHF and nephrology providers @ [facility] along w/ cardioMEMS measurement at home following d/c.  Ultimately, feel that volume overload largely being driven by renal failure at this point.  3.  Paroxysmal atrial fibrillation/flutter: Status post catheter ablation 2012 repeat ablation in 2024 for atypical flutter.  Also status post cardioversion in November 2024 maintaining sinus rhythm on amiodarone .  QTc was mildly prolonged on admission in the setting of hypokalemia.  Continue Eliquis .  4.  Primary hypertension: Orthostatic on examination yesterday.  Amlodipine on hold and BiDil  dose reduced.  Continue beta-blocker at current dose.  5.  CAD/demand ischemia: Known severe small vessel distal circumflex disease on catheterization in December 2023.  Troponin mildly elevated at 226 in the setting of syncope and heart  failure.  No chest pain.  Echo with normal LV function.  No plan for ischemic evaluation at this time.  Continue beta-blocker and statin therapy.  No aspirin  in the setting of Eliquis .  6.  Hyperlipidemia: Continue statin.  7.  CKD 5: Creatinine 6.55 this morning-up since admission.  Suspect chronic volume overload in the setting of progressive renal failure.  Patient previously underwent AV fistula placement and is followed by nephrology as an outpatient.  8.  Type 2 diabetes mellitus: Per internal medicine.  9.  Normocytic anemia: Relatively stable at 8.2/26.5.  10. Hypokalemia:  K 3.6 this AM.   Signed, Lonni Meager, NP  12/25/2023, 7:53 AM    For questions or updates, please contact   Please consult [email or web address] for contact info under Cardiology/STEMI. d

## 2023-12-25 NOTE — Assessment & Plan Note (Signed)
 Patient having weight loss with Mounjaro.  Will have to have close follow-up on whether medication doses will need to be decreased.  Will discontinue insulin .  Coreg  dose cut in half.  BiDil  dose cut in half.  Norvasc discontinued.

## 2023-12-25 NOTE — Discharge Summary (Signed)
 Physician Discharge Summary   Patient: Terry [name] MRN: [id number] DOB: 01/05/1976  Admit date:     12/23/2023  Discharge date: 12/25/23  Discharge Physician: Charlie Patterson   PCP: Pcp, No   Recommendations at discharge:   Follow-up with your medical doctor Follow-up with your cardiologist Follow-up with your nephrologist  Discharge Diagnoses: Principal Problem:   Orthostatic hypotension Active Problems:   Syncope   Ventricular tachycardia (HCC)   Chronic systolic CHF (congestive heart failure) (HCC)   Acute kidney injury superimposed on CKD   Myocardial injury   Paroxysmal atrial fibrillation (HCC)   History of CVA (cerebrovascular accident)   Type 2 diabetes mellitus with hyperlipidemia (HCC)   History of gout   Anemia of chronic disease   Weight loss  Resolved Problems:   * No resolved hospital problems. *  Hospital Course: 48 year old man past medical history of heart failure with improved ejection fraction, nonischemic cardiomyopathy with AICD, chronic kidney disease stage V, paroxysmal atrial fibrillation on Eliquis , history of CAD AVA, type 2 diabetes mellitus, multiple syncopal episodes presents to the ER with syncopal episode after a bowel movement.  He went to stand up and then lost consciousness.  He was unresponsive and eyes were open but did not answer some right away.  He felt sweaty.  No confusion after he came improved.  Patient had a few runs of asymptomatic ventricular tachycardia on telemetry.  11/22.  Patient feeling well this morning.  Had 29 beats of ventricular tachycardia.  [employer] came to interrogate the AICD.  The AICD only fired as if the rate goes over 200 and has rate was not over 200 for this.  Patient had slight orthostasis and BiDil  dose decreased. 11/23.  Patient feeling well.  I ambulated him around the nursing station without any dizziness or lightheadedness.  Patient does have slight orthostasis again this morning.  I held his  torsemide  today.  Can restart tomorrow.  TED hose given.  Assessment and Plan: * Orthostatic hypotension Coreg  dose cut back on admission.  Norvasc discontinued.  BiDil  dose decreased to half tablet 3 times daily yesterday.  Will hold torsemide  today and can restart tomorrow.  TED hose.  Stay hydrated.  When going from a lying position to seated position slowly get up and sit for a few minutes before standing up.  Stand there for a few minutes to make sure you are doing okay prior to walking.  Keep a blood pressure log lying sitting standing for your PCP and cardiologist.  Syncope More likely orthostatic hypotension but could be vasovagal syncope after having a bowel movement.  Ventricular tachycardia (HCC) Patient had 29 beats of ventricular tachycardia asymptomatic.  Has also had small runs of ventricular tachycardia.  AICD interrogated by [employer] rep.  Apparently the AICD does not fire unless the rate is over 200.  His rates were not over 200.  Patient on amiodarone .  Appreciate cardiology consultation.  Chronic systolic CHF (congestive heart failure) (HCC) Patient has improved ejection fraction.  Today's EF 50 to 55%.  Patient on oral Coreg  and BiDil .  Held torsemide  this morning can restart tomorrow.  Unable to give ACE/ARB or spironolactone with chronic kidney disease stage V.  Acute kidney injury superimposed on CKD AKI on CKD stage V.  Held torsemide  today.  Cut back on medications for orthostatic hypotension.  Hesitant on giving fluid bolus with his heart failure status.  Recommended following up with nephrology for evaluation and can consider dialysis in the future.  Myocardial injury This is not myocardial infarction.  Likely demand ischemia from chronic kidney disease, chronic congestive heart failure.  Paroxysmal atrial fibrillation (HCC) On amiodarone , Coreg , Eliquis .  History of CVA (cerebrovascular accident) Continue Eliquis  and cholesterol medication  Type 2 diabetes  mellitus with hyperlipidemia (HCC) Sugars on the lower side.  Continue Mounjaro and get rid of insulin .  Weight loss Patient having weight loss with Mounjaro.  Will have to have close follow-up on whether medication doses will need to be decreased.  Will discontinue insulin .  Coreg  dose cut in half.  BiDil  dose cut in half.  Norvasc discontinued.  Anemia of chronic disease Hemoglobin 8.2  History of gout On allopurinol  twice weekly         Consultants: Cardiology, [employer] representative for ICD interrogation Procedures performed: ICD interrogation Disposition: Home Diet recommendation:  Cardiac and Carb modified diet DISCHARGE MEDICATION: Allergies as of 12/25/2023   No Known Allergies      Medication List     STOP taking these medications    amLODipine 5 MG tablet Commonly known as: NORVASC   hydrALAZINE  50 MG tablet Commonly known as: APRESOLINE    insulin  aspart 100 UNIT/ML injection Commonly known as: novoLOG    Lantus  SoloStar 100 UNIT/ML Solostar Pen Generic drug: insulin  glargine   metolazone 2.5 MG tablet Commonly known as: ZAROXOLYN   ondansetron  8 MG disintegrating tablet Commonly known as: ZOFRAN -ODT       TAKE these medications    acetaminophen  325 MG tablet Commonly known as: TYLENOL  Take 650 mg by mouth daily as needed.   allopurinol  100 MG tablet Commonly known as: ZYLOPRIM  Take 50 mg by mouth 2 (two) times a week.   amiodarone  200 MG tablet Commonly known as: PACERONE  Take 1 tablet by mouth daily.   anakinra 100 MG/0.67ML Sosy injection Commonly known as: KINERET Inject 100 mg into the skin.  Inject the contents of 1 syringe (100 mg total) under the skin daily as needed (for 3-5 days with gout flares).   apixaban  2.5 MG Tabs tablet Commonly known as: ELIQUIS  Take 1 tablet (2.5 mg total) by mouth 2 (two) times daily. What changed:  medication strength how much to take   atorvastatin  40 MG tablet Commonly known as:  LIPITOR Take 40 mg by mouth daily.   blood glucose meter kit and supplies Kit Dispense based on patient and insurance preference. Use up to four times daily as directed. (FOR ICD-9 250.00, 250.01).   carvedilol  25 MG tablet Commonly known as: COREG  Take 1 tablet (25 mg total) by mouth 2 (two) times daily with a meal. What changed: how much to take   cyanocobalamin  1000 MCG tablet Take 1 tablet (1,000 mcg total) by mouth daily.   Insulin  Syringe-Needle U-100 25G X 5/8 1 ML Misc Needs enough insulin  needles & syringes for insulin  aspart 5 units TID w/ meals & insulin  NPH 20 units daily for 30 days   isosorbide -hydrALAZINE  20-37.5 MG tablet Commonly known as: BIDIL  Take 0.5 tablets by mouth 3 (three) times daily. What changed: how much to take   Mounjaro 5 MG/0.5ML Pen Generic drug: tirzepatide Inject 5 mg into the skin once a week.   oxyCODONE  5 MG immediate release tablet Commonly known as: Oxy IR/ROXICODONE  Take by mouth.   torsemide  100 MG tablet Commonly known as: DEMADEX  Take 1 tablet by mouth daily.        Follow-up Information     your medical doctor Follow up in 5 day(s).   Why: patient  to make own follow up appt               Discharge Exam: Filed Weights   12/23/23 1250  Weight: 120.2 kg   Physical Exam HENT:     Head: Normocephalic.     Mouth/Throat:     Pharynx: No oropharyngeal exudate.  Eyes:     General: Lids are normal.     Conjunctiva/sclera: Conjunctivae normal.  Cardiovascular:     Rate and Rhythm: Normal rate and regular rhythm.     Heart sounds: Normal heart sounds, S1 normal and S2 normal.  Pulmonary:     Breath sounds: No decreased breath sounds, wheezing, rhonchi or rales.  Abdominal:     Palpations: Abdomen is soft.     Tenderness: There is no abdominal tenderness.  Musculoskeletal:     Right lower leg: Swelling present.     Left lower leg: Swelling present.  Skin:    General: Skin is warm.     Findings: No rash.   Neurological:     Mental Status: He is alert and oriented to person, place, and time.      Condition at discharge: stable  The results of significant diagnostics from this hospitalization (including imaging, microbiology, ancillary and laboratory) are listed below for reference.   Imaging Studies: ECHOCARDIOGRAM COMPLETE Result Date: 12/24/2023    ECHOCARDIOGRAM REPORT   Patient Name:   Terry [name] Date of Exam: 12/24/2023 Medical Rec #:  [id number]        Height:       68.0 in Accession #:    [id number]       Weight:       265.0 lb Date of Birth:  Jun 09, 1975        BSA:          2.303 m Patient Age:    48 years         BP:           130/74 mmHg Patient Gender: M                HR:           75 bpm. Exam Location:  [facility] Procedure: 2D Echo, Color Doppler and Cardiac Doppler (Both Spectral and Color            Flow Doppler were utilized during procedure). Indications:    Syncope R55  History:        Patient has prior history of Echocardiogram examinations. CHF;                 CAD.  Sonographer:    Bari Roar Referring Phys: [id number] LANETA BLUNT Diagnosing      Caron Poser Phys: IMPRESSIONS  1. Left ventricular ejection fraction, by estimation, is 50 to 55%. The left ventricle has low normal function. Left ventricular endocardial border not optimally defined to evaluate regional wall motion. The left ventricular internal cavity size was mildly dilated. There is severe concentric left ventricular hypertrophy. Left ventricular diastolic parameters are consistent with Grade I diastolic dysfunction (impaired relaxation).  2. Right ventricular systolic function is normal. The right ventricular size is mildly enlarged. There is mildly elevated pulmonary artery systolic pressure. The estimated right ventricular systolic pressure is 36.4 mmHg.  3. The mitral valve is normal in structure. Mild to moderate mitral valve regurgitation.  4. The aortic valve has an indeterminant number of cusps. Aortic  valve regurgitation is not visualized. Aortic valve sclerosis/calcification is present, without any  evidence of aortic stenosis.  5. The inferior vena cava is normal in size with greater than 50% respiratory variability, suggesting right atrial pressure of 3 mmHg. Comparison(s): A prior study was performed on 06/14/2023. LV function has improved and PASP measures lower. FINDINGS  Left Ventricle: Left ventricular ejection fraction, by estimation, is 50 to 55%. The left ventricle has low normal function. Left ventricular endocardial border not optimally defined to evaluate regional wall motion. The left ventricular internal cavity  size was mildly dilated. There is severe concentric left ventricular hypertrophy. Left ventricular diastolic parameters are consistent with Grade I diastolic dysfunction (impaired relaxation). Right Ventricle: The right ventricular size is mildly enlarged. Right vetricular wall thickness was not well visualized. Right ventricular systolic function is normal. There is mildly elevated pulmonary artery systolic pressure. The tricuspid regurgitant  velocity is 2.89 m/s, and with an assumed right atrial pressure of 3 mmHg, the estimated right ventricular systolic pressure is 36.4 mmHg. Left Atrium: Left atrial size was mildly dilated. Right Atrium: Right atrial size was mildly dilated. Pericardium: Trivial pericardial effusion is present. Mitral Valve: The mitral valve is normal in structure. Mild to moderate mitral valve regurgitation. MV peak gradient, 4.2 mmHg. The mean mitral valve gradient is 2.0 mmHg. Tricuspid Valve: The tricuspid valve is normal in structure. Tricuspid valve regurgitation is mild . No evidence of tricuspid stenosis. Aortic Valve: The aortic valve has an indeterminant number of cusps. There is mild to moderate aortic valve annular calcification. Aortic valve regurgitation is not visualized. Aortic valve sclerosis/calcification is present, without any evidence of aortic  stenosis. Aortic valve mean gradient measures 7.0 mmHg. Aortic valve peak gradient measures 13.4 mmHg. Aortic valve area, by VTI measures 2.13 cm. Pulmonic Valve: The pulmonic valve was not well visualized. Pulmonic valve regurgitation is not visualized. No evidence of pulmonic stenosis. Aorta: The aortic root was not well visualized and the ascending aorta was not well visualized. Venous: The inferior vena cava is normal in size with greater than 50% respiratory variability, suggesting right atrial pressure of 3 mmHg. IAS/Shunts: The interatrial septum was not well visualized. Additional Comments: A device lead is visualized.  LEFT VENTRICLE PLAX 2D LVIDd:         5.90 cm      Diastology LVIDs:         4.30 cm      LV e' medial:    6.09 cm/s LV PW:         1.50 cm      LV E/e' medial:  13.4 LV IVS:        1.70 cm      LV e' lateral:   9.46 cm/s LVOT diam:     2.20 cm      LV E/e' lateral: 8.6 LV SV:         72 LV SV Index:   31 LVOT Area:     3.80 cm  LV Volumes (MOD) LV vol d, MOD A2C: 133.0 ml LV vol d, MOD A4C: 148.0 ml LV vol s, MOD A2C: 66.3 ml LV vol s, MOD A4C: 68.3 ml LV SV MOD A2C:     66.7 ml LV SV MOD A4C:     148.0 ml LV SV MOD BP:      72.1 ml RIGHT VENTRICLE RV Basal diam:  4.20 cm RV Mid diam:    3.60 cm RV S prime:     13.80 cm/s TAPSE (M-mode): 2.8 cm LEFT ATRIUM  Index        RIGHT ATRIUM           Index LA diam:        4.60 cm 2.00 cm/m   RA Area:     19.80 cm LA Vol (A2C):   74.9 ml 32.52 ml/m  RA Volume:   57.70 ml  25.05 ml/m LA Vol (A4C):   97.3 ml 42.24 ml/m LA Biplane Vol: 86.5 ml 37.55 ml/m  AORTIC VALVE                     PULMONIC VALVE AV Area (Vmax):    2.22 cm      PV Vmax:        1.28 m/s AV Area (Vmean):   1.99 cm      PV Peak grad:   6.6 mmHg AV Area (VTI):     2.13 cm      RVOT Peak grad: 4 mmHg AV Vmax:           183.00 cm/s AV Vmean:          127.000 cm/s AV VTI:            0.339 m AV Peak Grad:      13.4 mmHg AV Mean Grad:      7.0 mmHg LVOT Vmax:          107.00 cm/s LVOT Vmean:        66.400 cm/s LVOT VTI:          0.190 m LVOT/AV VTI ratio: 0.56  AORTA Ao Root diam: 2.90 cm Ao Asc diam:  3.20 cm MITRAL VALVE               TRICUSPID VALVE MV Area (PHT): 2.72 cm    TR Peak grad:   33.4 mmHg MV Area VTI:   2.53 cm    TR Vmax:        289.00 cm/s MV Peak grad:  4.2 mmHg MV Mean grad:  2.0 mmHg    SHUNTS MV Vmax:       1.03 m/s    Systemic VTI:  0.19 m MV Vmean:      63.1 cm/s   Systemic Diam: 2.20 cm MV Decel Time: 279 msec MV E velocity: 81.80 cm/s MV A velocity: 33.00 cm/s MV E/A ratio:  2.48 MV A Prime:    3.5 cm/s Caron Poser Electronically signed by Caron Poser Signature Date/Time: 12/24/2023/11:30:46 AM    Final    DG Shoulder Right Result Date: 12/23/2023 EXAM: 1 VIEW(S) XRAY OF THE RIGHT SHOULDER 12/23/2023 01:54:00 PM COMPARISON: None available. CLINICAL HISTORY: fall, pain FINDINGS: BONES AND JOINTS: Glenohumeral joint is normally aligned. No acute fracture or dislocation. Mild degenerative changes of the acromioclavicular joint. SOFT TISSUES: Surgical clips in upper arm. No abnormal calcifications. Visualized lung is unremarkable. IMPRESSION: 1. No acute fracture or dislocation. 2. Mild acromioclavicular joint degenerative changes. Electronically signed by: Waddell Calk MD 12/23/2023 02:41 PM EST RP Workstation: [id number]   DG Chest 2 View Result Date: 12/23/2023 EXAM: 2 VIEW(S) XRAY OF THE CHEST 12/23/2023 01:54:00 PM COMPARISON: 06/15/2023 CLINICAL HISTORY: fall, pain FINDINGS: LINES, TUBES AND DEVICES: Left chest single lead AICD in place. LUNGS AND PLEURA: No focal pulmonary opacity. No pleural effusion. No pneumothorax. HEART AND MEDIASTINUM: Mild cardiomegaly. BONES AND SOFT TISSUES: No acute fracture or destructive lesion. Multilevel thoracic osteophytosis. IMPRESSION: 1. No acute cardiopulmonary process. 2. Mild cardiomegaly. Electronically signed by: Waddell Calk MD 12/23/2023 02:39  PM EST RP Workstation: [id number]   CT Head Wo  Contrast Result Date: 12/23/2023 EXAM: CT HEAD AND CERVICAL SPINE 12/23/2023 01:09:20 PM TECHNIQUE: CT of the head and cervical spine was performed without the administration of intravenous contrast. Multiplanar reformatted images are provided for review. Automated exposure control, iterative reconstruction, and/or weight based adjustment of the mA/kV was utilized to reduce the radiation dose to as low as reasonably achievable. COMPARISON: None available. CLINICAL HISTORY: syncope/fall on eliquis  FINDINGS: CT HEAD BRAIN AND VENTRICLES: No acute intracranial hemorrhage. No mass effect or midline shift. No abnormal extra-axial fluid collection. No evidence of acute infarct. No hydrocephalus. ORBITS: No acute abnormality. SINUSES AND MASTOIDS: No acute abnormality. SOFT TISSUES AND SKULL: No acute skull fracture. No acute soft tissue abnormality. CT CERVICAL SPINE BONES AND ALIGNMENT: No acute fracture or traumatic malalignment. DEGENERATIVE CHANGES: C4-C5 degenerative disc with disease height loss, endplate sclerosis and spurring, and schmorl's nodes. SOFT TISSUES: No prevertebral soft tissue swelling. IMPRESSION: 1. No acute abnormality intracranially or in the cervical spine. 2. C4-C5 degenerative disc disease. Electronically signed by: Gilmore Molt MD 12/23/2023 01:25 PM EST RP Workstation: [id number]   CT Cervical Spine Wo Contrast Result Date: 12/23/2023 EXAM: CT HEAD AND CERVICAL SPINE 12/23/2023 01:09:20 PM TECHNIQUE: CT of the head and cervical spine was performed without the administration of intravenous contrast. Multiplanar reformatted images are provided for review. Automated exposure control, iterative reconstruction, and/or weight based adjustment of the mA/kV was utilized to reduce the radiation dose to as low as reasonably achievable. COMPARISON: None available. CLINICAL HISTORY: syncope/fall on eliquis  FINDINGS: CT HEAD BRAIN AND VENTRICLES: No acute intracranial hemorrhage. No mass effect or  midline shift. No abnormal extra-axial fluid collection. No evidence of acute infarct. No hydrocephalus. ORBITS: No acute abnormality. SINUSES AND MASTOIDS: No acute abnormality. SOFT TISSUES AND SKULL: No acute skull fracture. No acute soft tissue abnormality. CT CERVICAL SPINE BONES AND ALIGNMENT: No acute fracture or traumatic malalignment. DEGENERATIVE CHANGES: C4-C5 degenerative disc with disease height loss, endplate sclerosis and spurring, and schmorl's nodes. SOFT TISSUES: No prevertebral soft tissue swelling. IMPRESSION: 1. No acute abnormality intracranially or in the cervical spine. 2. C4-C5 degenerative disc disease. Electronically signed by: Gilmore Molt MD 12/23/2023 01:25 PM EST RP Workstation: [id number]     Labs: CBC: Recent Labs  Lab 12/23/23 1300 12/25/23 0416  WBC 5.7 4.6  NEUTROABS 4.1  --   HGB 8.4* 8.2*  HCT 26.6* 26.5*  MCV 92.0 92.7  PLT 153 156   Basic Metabolic Panel: Recent Labs  Lab 12/23/23 1300 12/23/23 2144 12/24/23 0559 12/25/23 0416  NA 139 142 142 138  K 3.8 3.4* 3.8 3.6  CL 103 106 104 99  CO2 21* 24 25 27   GLUCOSE 159* 101* 100* 117*  BUN 73* 77* 80* 76*  CREATININE 6.30* 6.03* 6.39* 6.55*  CALCIUM  8.0* 7.9* 8.4* 8.3*  MG 2.2 2.1 2.2  --    Liver Function Tests: Recent Labs  Lab 12/23/23 1300  AST 13*  ALT <5  ALKPHOS 87  BILITOT 0.6  PROT 6.3*  ALBUMIN  3.3*   CBG: Recent Labs  Lab 12/24/23 0839 12/24/23 1233 12/24/23 1627 12/24/23 2055 12/25/23 0818  GLUCAP 91 88 90 106* 112*    Discharge time spent: greater than 30 minutes.  Signed: Charlie Patterson, MD [facility] 12/25/2023
